# Patient Record
Sex: Male | Born: 1970 | Race: White | Hispanic: No | Marital: Married | Smoking: Former smoker
Health system: Southern US, Community
[De-identification: ages and names within clinical notes are randomized; demographics above are authoritative.]

## PROBLEM LIST (undated history)

## (undated) DIAGNOSIS — G40219 Localization-related (focal) (partial) symptomatic epilepsy and epileptic syndromes with complex partial seizures, intractable, without status epilepticus: Secondary | ICD-10-CM

## (undated) HISTORY — PX: OTHER SURGICAL HISTORY: SHX169

## (undated) HISTORY — PX: BRAIN SURGERY: SHX531

---

## 1998-01-09 ENCOUNTER — Emergency Department (HOSPITAL_COMMUNITY): Admission: EM | Admit: 1998-01-09 | Discharge: 1998-01-09 | Payer: Self-pay | Admitting: Emergency Medicine

## 2000-07-30 ENCOUNTER — Encounter: Payer: Self-pay | Admitting: Family Medicine

## 2000-07-30 ENCOUNTER — Encounter: Admission: RE | Admit: 2000-07-30 | Discharge: 2000-07-30 | Payer: Self-pay | Admitting: Family Medicine

## 2005-04-29 ENCOUNTER — Emergency Department (HOSPITAL_COMMUNITY): Admission: EM | Admit: 2005-04-29 | Discharge: 2005-04-29 | Payer: Self-pay | Admitting: Emergency Medicine

## 2011-04-03 ENCOUNTER — Emergency Department (HOSPITAL_COMMUNITY)
Admission: EM | Admit: 2011-04-03 | Discharge: 2011-04-04 | Disposition: A | Discharge status: Home / Self Care | Payer: 59 | Attending: Emergency Medicine | Admitting: Emergency Medicine

## 2011-04-03 DIAGNOSIS — R002 Palpitations: Secondary | ICD-10-CM | POA: Insufficient documentation

## 2011-04-03 DIAGNOSIS — G40909 Epilepsy, unspecified, not intractable, without status epilepticus: Secondary | ICD-10-CM | POA: Insufficient documentation

## 2011-04-03 DIAGNOSIS — E876 Hypokalemia: Secondary | ICD-10-CM | POA: Insufficient documentation

## 2011-04-03 DIAGNOSIS — Z79899 Other long term (current) drug therapy: Secondary | ICD-10-CM | POA: Insufficient documentation

## 2011-04-03 DIAGNOSIS — R000 Tachycardia, unspecified: Secondary | ICD-10-CM | POA: Insufficient documentation

## 2011-04-03 DIAGNOSIS — F411 Generalized anxiety disorder: Secondary | ICD-10-CM | POA: Insufficient documentation

## 2011-04-03 LAB — DIFFERENTIAL
Basophils Absolute: 0 10*3/uL (ref 0.0–0.1)
Basophils Relative: 0 % (ref 0–1)
Eosinophils Absolute: 0.1 10*3/uL (ref 0.0–0.7)
Eosinophils Relative: 1 % (ref 0–5)
Lymphocytes Relative: 26 % (ref 12–46)
Lymphs Abs: 2.6 10*3/uL (ref 0.7–4.0)
Monocytes Absolute: 0.8 10*3/uL (ref 0.1–1.0)
Monocytes Relative: 8 % (ref 3–12)
Neutro Abs: 6.5 10*3/uL (ref 1.7–7.7)
Neutrophils Relative %: 64 % (ref 43–77)

## 2011-04-03 LAB — CBC
HCT: 39.7 % (ref 39.0–52.0)
Hemoglobin: 13.8 g/dL (ref 13.0–17.0)
MCH: 30.9 pg (ref 26.0–34.0)
MCHC: 34.8 g/dL (ref 30.0–36.0)
MCV: 89 fL (ref 78.0–100.0)
Platelets: 256 10*3/uL (ref 150–400)
RBC: 4.46 MIL/uL (ref 4.22–5.81)
RDW: 12.7 % (ref 11.5–15.5)
WBC: 10.1 10*3/uL (ref 4.0–10.5)

## 2011-04-04 LAB — COMPREHENSIVE METABOLIC PANEL
ALT: 18 U/L (ref 0–53)
AST: 16 U/L (ref 0–37)
Albumin: 4.3 g/dL (ref 3.5–5.2)
Alkaline Phosphatase: 77 U/L (ref 39–117)
BUN: 19 mg/dL (ref 6–23)
CO2: 25 mEq/L (ref 19–32)
Calcium: 9.7 mg/dL (ref 8.4–10.5)
Chloride: 101 mEq/L (ref 96–112)
Creatinine, Ser: 0.94 mg/dL (ref 0.50–1.35)
GFR calc Af Amer: >90 mL/min (ref >90)
GFR calc non Af Amer: >90 mL/min (ref >90)
Glucose, Bld: 111 mg/dL — ABNORMAL HIGH (ref 70–99)
Potassium: 3.2 mEq/L — ABNORMAL LOW (ref 3.5–5.1)
Sodium: 138 mEq/L (ref 135–145)
Total Bilirubin: 0.5 mg/dL (ref 0.3–1.2)
Total Protein: 6.5 g/dL (ref 6.0–8.3)

## 2011-04-04 LAB — POCT I-STAT TROPONIN I: Troponin i, poc: 0 ng/mL (ref 0.00–0.08)

## 2011-04-04 LAB — MAGNESIUM: Magnesium: 2 mg/dL (ref 1.5–2.5)

## 2013-01-22 ENCOUNTER — Emergency Department (HOSPITAL_COMMUNITY): Payer: BC Managed Care – PPO

## 2013-01-22 ENCOUNTER — Emergency Department (HOSPITAL_COMMUNITY)
Admission: EM | Admit: 2013-01-22 | Discharge: 2013-01-22 | Disposition: A | Discharge status: Home / Self Care | Payer: BC Managed Care – PPO | Attending: Emergency Medicine | Admitting: Emergency Medicine

## 2013-01-22 ENCOUNTER — Encounter (HOSPITAL_COMMUNITY): Payer: Self-pay

## 2013-01-22 DIAGNOSIS — G40909 Epilepsy, unspecified, not intractable, without status epilepticus: Secondary | ICD-10-CM | POA: Insufficient documentation

## 2013-01-22 DIAGNOSIS — Y92410 Unspecified street and highway as the place of occurrence of the external cause: Secondary | ICD-10-CM | POA: Insufficient documentation

## 2013-01-22 DIAGNOSIS — Y9389 Activity, other specified: Secondary | ICD-10-CM | POA: Insufficient documentation

## 2013-01-22 DIAGNOSIS — S298XXA Other specified injuries of thorax, initial encounter: Secondary | ICD-10-CM | POA: Insufficient documentation

## 2013-01-22 LAB — RAPID URINE DRUG SCREEN, HOSP PERFORMED
Amphetamines: NOT DETECTED
Cocaine: NOT DETECTED
Opiates: NOT DETECTED
Tetrahydrocannabinol: POSITIVE — AB

## 2013-01-22 LAB — COMPREHENSIVE METABOLIC PANEL
AST: 28 U/L (ref 0–37)
Albumin: 4.4 g/dL (ref 3.5–5.2)
BUN: 18 mg/dL (ref 6–23)
Calcium: 9.4 mg/dL (ref 8.4–10.5)
Chloride: 101 mEq/L (ref 96–112)
Creatinine, Ser: 0.81 mg/dL (ref 0.50–1.35)
Total Protein: 7 g/dL (ref 6.0–8.3)

## 2013-01-22 LAB — URINALYSIS, ROUTINE W REFLEX MICROSCOPIC
Glucose, UA: NEGATIVE mg/dL
Hgb urine dipstick: NEGATIVE
Leukocytes, UA: NEGATIVE
Specific Gravity, Urine: 1.022 (ref 1.005–1.030)
pH: 7.5 (ref 5.0–8.0)

## 2013-01-22 LAB — CBC WITH DIFFERENTIAL/PLATELET
Basophils Absolute: 0 10*3/uL (ref 0.0–0.1)
Basophils Relative: 0 % (ref 0–1)
Eosinophils Absolute: 0 10*3/uL (ref 0.0–0.7)
HCT: 44.3 % (ref 39.0–52.0)
MCH: 32 pg (ref 26.0–34.0)
MCHC: 35.2 g/dL (ref 30.0–36.0)
Monocytes Absolute: 0.9 10*3/uL (ref 0.1–1.0)
Monocytes Relative: 7 % (ref 3–12)
Neutro Abs: 8.9 10*3/uL — ABNORMAL HIGH (ref 1.7–7.7)
RDW: 12.9 % (ref 11.5–15.5)

## 2013-01-22 MED ORDER — ONDANSETRON 4 MG PO TBDP
8.0000 mg | ORAL_TABLET | Freq: Once | ORAL | Status: AC
  Administered 2013-01-22: 8 mg via ORAL
  Filled 2013-01-22: qty 2

## 2013-01-22 MED ORDER — HYDROCODONE-ACETAMINOPHEN 5-325 MG PO TABS
2.0000 | ORAL_TABLET | Freq: Once | ORAL | Status: AC
  Administered 2013-01-22: 2 via ORAL
  Filled 2013-01-22: qty 2

## 2013-01-22 MED ORDER — HYDROMORPHONE HCL PF 2 MG/ML IJ SOLN
2.0000 mg | Freq: Once | INTRAMUSCULAR | Status: AC
  Administered 2013-01-22: 2 mg via INTRAMUSCULAR
  Filled 2013-01-22: qty 1

## 2013-01-22 MED ORDER — HYDROCODONE-ACETAMINOPHEN 5-325 MG PO TABS: 1.0000 | ORAL_TABLET | Freq: Four times a day (QID) | ORAL | Status: DC | PRN

## 2013-01-22 MED ORDER — LORAZEPAM 2 MG/ML IJ SOLN
1.0000 mg | Freq: Once | INTRAMUSCULAR | Status: AC
  Administered 2013-01-22: 1 mg via INTRAVENOUS
  Filled 2013-01-22: qty 1

## 2013-01-22 NOTE — ED Notes (Signed)
Called to CT to see what is taking so long. Tech sts they are running behind, will get him as soon as possible.
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
## 2013-01-22 NOTE — ED Notes (Signed)
Pt made aware of need for urine sample. Given urinal.
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
## 2013-01-22 NOTE — ED Notes (Signed)
Pt returned from radiology.
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
## 2013-01-22 NOTE — ED Notes (Signed)
Pt returned from CT, acting different than when he had left. Pt unable to respond to questions, trying to pull O2 monitor off. Friend at bedside sts he started speaking in spanish to him. Pt in nad, airway intact.
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
## 2015-06-16 ENCOUNTER — Other Ambulatory Visit: Payer: Self-pay | Admitting: Occupational Medicine

## 2015-06-16 ENCOUNTER — Ambulatory Visit: Payer: Self-pay

## 2015-06-16 DIAGNOSIS — Z0000 Encounter for general adult medical examination without abnormal findings: Secondary | ICD-10-CM

## 2017-03-14 ENCOUNTER — Emergency Department (HOSPITAL_COMMUNITY): Payer: Medicaid Other

## 2017-03-14 ENCOUNTER — Inpatient Hospital Stay (HOSPITAL_COMMUNITY)
Admission: EM | Admit: 2017-03-14 | Discharge: 2017-03-17 | DRG: 101 | Disposition: A | Discharge status: Home / Self Care | Payer: Medicaid Other | Attending: Internal Medicine | Admitting: Internal Medicine

## 2017-03-14 ENCOUNTER — Encounter (HOSPITAL_COMMUNITY): Payer: Self-pay | Admitting: Emergency Medicine

## 2017-03-14 DIAGNOSIS — G40909 Epilepsy, unspecified, not intractable, without status epilepticus: Secondary | ICD-10-CM

## 2017-03-14 DIAGNOSIS — R569 Unspecified convulsions: Secondary | ICD-10-CM

## 2017-03-14 DIAGNOSIS — F29 Unspecified psychosis not due to a substance or known physiological condition: Secondary | ICD-10-CM | POA: Diagnosis present

## 2017-03-14 DIAGNOSIS — E876 Hypokalemia: Secondary | ICD-10-CM | POA: Diagnosis present

## 2017-03-14 DIAGNOSIS — K648 Other hemorrhoids: Secondary | ICD-10-CM | POA: Diagnosis present

## 2017-03-14 DIAGNOSIS — N179 Acute kidney failure, unspecified: Secondary | ICD-10-CM | POA: Diagnosis present

## 2017-03-14 DIAGNOSIS — G40219 Localization-related (focal) (partial) symptomatic epilepsy and epileptic syndromes with complex partial seizures, intractable, without status epilepticus: Principal | ICD-10-CM | POA: Diagnosis present

## 2017-03-14 DIAGNOSIS — Z8249 Family history of ischemic heart disease and other diseases of the circulatory system: Secondary | ICD-10-CM

## 2017-03-14 DIAGNOSIS — Z9114 Patient's other noncompliance with medication regimen: Secondary | ICD-10-CM

## 2017-03-14 DIAGNOSIS — F05 Delirium due to known physiological condition: Secondary | ICD-10-CM | POA: Diagnosis present

## 2017-03-14 DIAGNOSIS — F28 Other psychotic disorder not due to a substance or known physiological condition: Secondary | ICD-10-CM

## 2017-03-14 DIAGNOSIS — F22 Delusional disorders: Secondary | ICD-10-CM

## 2017-03-14 DIAGNOSIS — Z23 Encounter for immunization: Secondary | ICD-10-CM

## 2017-03-14 DIAGNOSIS — M6282 Rhabdomyolysis: Secondary | ICD-10-CM | POA: Diagnosis present

## 2017-03-14 DIAGNOSIS — I10 Essential (primary) hypertension: Secondary | ICD-10-CM | POA: Diagnosis present

## 2017-03-14 HISTORY — DX: Localization-related (focal) (partial) symptomatic epilepsy and epileptic syndromes with complex partial seizures, intractable, without status epilepticus: G40.219

## 2017-03-14 LAB — CBC WITH DIFFERENTIAL/PLATELET
BASOS ABS: 0 10*3/uL (ref 0.0–0.1)
Basophils Relative: 0 %
Eosinophils Absolute: 0.1 10*3/uL (ref 0.0–0.7)
Eosinophils Relative: 1 %
HEMATOCRIT: 40.5 % (ref 39.0–52.0)
Hemoglobin: 14.1 g/dL (ref 13.0–17.0)
LYMPHS ABS: 2.8 10*3/uL (ref 0.7–4.0)
LYMPHS PCT: 33 %
MCH: 29.5 pg (ref 26.0–34.0)
MCHC: 34.8 g/dL (ref 30.0–36.0)
MCV: 84.7 fL (ref 78.0–100.0)
Monocytes Absolute: 0.8 10*3/uL (ref 0.1–1.0)
Monocytes Relative: 9 %
NEUTROS PCT: 57 %
Neutro Abs: 4.7 10*3/uL (ref 1.7–7.7)
Platelets: 362 10*3/uL (ref 150–400)
RBC: 4.78 MIL/uL (ref 4.22–5.81)
RDW: 12.7 % (ref 11.5–15.5)
WBC: 8.4 10*3/uL (ref 4.0–10.5)

## 2017-03-14 LAB — URINALYSIS, ROUTINE W REFLEX MICROSCOPIC
Bilirubin Urine: NEGATIVE
GLUCOSE, UA: NEGATIVE mg/dL
Hgb urine dipstick: NEGATIVE
KETONES UR: 5 mg/dL — AB
LEUKOCYTES UA: NEGATIVE
NITRITE: NEGATIVE
PH: 5 (ref 5.0–8.0)
PROTEIN: NEGATIVE mg/dL
Specific Gravity, Urine: 1.005 (ref 1.005–1.030)

## 2017-03-14 LAB — RAPID URINE DRUG SCREEN, HOSP PERFORMED
Amphetamines: NOT DETECTED
Barbiturates: NOT DETECTED
Benzodiazepines: NOT DETECTED
COCAINE: NOT DETECTED
OPIATES: NOT DETECTED
Tetrahydrocannabinol: NOT DETECTED

## 2017-03-14 LAB — COMPREHENSIVE METABOLIC PANEL
ALT: 26 U/L (ref 17–63)
AST: 36 U/L (ref 15–41)
Albumin: 4.7 g/dL (ref 3.5–5.0)
Alkaline Phosphatase: 81 U/L (ref 38–126)
Anion gap: 12 (ref 5–15)
BUN: 22 mg/dL — ABNORMAL HIGH (ref 6–20)
CHLORIDE: 106 mmol/L (ref 101–111)
CO2: 21 mmol/L — AB (ref 22–32)
Calcium: 9.4 mg/dL (ref 8.9–10.3)
Creatinine, Ser: 1.76 mg/dL — ABNORMAL HIGH (ref 0.61–1.24)
GFR, EST AFRICAN AMERICAN: 53 mL/min — AB (ref >60)
GFR, EST NON AFRICAN AMERICAN: 45 mL/min — AB (ref >60)
Glucose, Bld: 95 mg/dL (ref 65–99)
Potassium: 3.1 mmol/L — ABNORMAL LOW (ref 3.5–5.1)
SODIUM: 139 mmol/L (ref 135–145)
Total Bilirubin: 1.1 mg/dL (ref 0.3–1.2)
Total Protein: 7.3 g/dL (ref 6.5–8.1)

## 2017-03-14 LAB — PHOSPHORUS: Phosphorus: 4.6 mg/dL (ref 2.5–4.6)

## 2017-03-14 LAB — MAGNESIUM: Magnesium: 2 mg/dL (ref 1.7–2.4)

## 2017-03-14 LAB — ETHANOL

## 2017-03-14 MED ORDER — LORAZEPAM 1 MG PO TABS
1.0000 mg | ORAL_TABLET | Freq: Once | ORAL | Status: AC
  Administered 2017-03-14: 1 mg via ORAL
  Filled 2017-03-14: qty 1

## 2017-03-14 MED ORDER — LEVETIRACETAM ER 500 MG PO TB24
1000.0000 mg | ORAL_TABLET | Freq: Once | ORAL | Status: AC
  Administered 2017-03-15: 1000 mg via ORAL
  Filled 2017-03-14: qty 2

## 2017-03-14 MED ORDER — POTASSIUM CHLORIDE CRYS ER 20 MEQ PO TBCR
40.0000 meq | EXTENDED_RELEASE_TABLET | Freq: Once | ORAL | Status: AC
  Administered 2017-03-14: 40 meq via ORAL
  Filled 2017-03-14: qty 2

## 2017-03-14 MED ORDER — SODIUM CHLORIDE 0.9 % IV BOLUS (SEPSIS)
1000.0000 mL | Freq: Once | INTRAVENOUS | Status: AC
  Administered 2017-03-14: 1000 mL via INTRAVENOUS

## 2017-03-14 MED ORDER — HALOPERIDOL 5 MG PO TABS
5.0000 mg | ORAL_TABLET | Freq: Once | ORAL | Status: AC
  Administered 2017-03-14: 5 mg via ORAL
  Filled 2017-03-14: qty 1

## 2017-03-14 NOTE — ED Provider Notes (Signed)
[facility] DEPT Provider Note   CSN: [id number] Arrival date & time: 03/14/17  1240     History   Chief Complaint Chief Complaint  Patient presents with  . Seizures    HPI Omar Payne is a 46 y.o. male.  HPI  Pt was seen at [place]. Per pt and his family, c/o gradual onset and persistence of constant "delusions" and "paranoia" for the past 3 days. Pt's family states pt has been "having more seizures than usual" for the past 3 days, and "sleeping a lot." Pt's family states pt has been talking about "someone hacking his phone," "seeing the devil at his house." Pt endorses compliance with his meds. Pt does not recall events. Denies SI, no HI, no CP/palpitations, no SOB/cough, no abd pain, no N/V/D, no neck or back pain, no fevers, no injury, no focal motor weakness, no tingling/numbness in extremities.    Past Medical History:  Diagnosis Date  . Localization-related (focal) (partial) symptomatic epilepsy and epileptic syndromes with complex partial seizures, intractable, without status epilepticus ([place])     There are no active problems to display for this patient.   Past Surgical History:  Procedure Laterality Date  . BRAIN SURGERY     right temporal lobectomy  . VNS     VNS implant       Home Medications    Prior to Admission medications   Medication Sig Start Date End Date Taking? Authorizing Provider  Aloe-Sodium Chloride (AYR SALINE NASAL GEL NA) Place 1 application into the nose 2 (two) times daily as needed.   Yes [provider]  fluticasone (FLONASE) 50 MCG/ACT nasal spray Place 2 sprays into the nose daily. 01/30/17 01/30/18 Yes [provider]  levETIRAcetam (KEPPRA) 500 MG tablet Take 1,000 mg by mouth 2 (two) times daily.    Yes [provider]  zonisamide (ZONEGRAN) 100 MG capsule Take 100 mg by mouth daily.   Yes [provider]  HYDROcodone-acetaminophen (NORCO) 5-325 MG per tablet Take 1 tablet by mouth every 6 (six)  hours as needed for pain. Patient not taking: Reported on 03/14/2017 01/22/13   Orlie Dakin, MD    Family History History reviewed. No pertinent family history.  Social History Social History  Substance Use Topics  . Smoking status: Never Smoker  . Smokeless tobacco: Never Used  . Alcohol use No     Allergies   Patient has no known allergies.   Review of Systems Review of Systems ROS: Statement: All systems negative except as marked or noted in the HPI; Constitutional: Negative for fever and chills. ; ; Eyes: Negative for eye pain, redness and discharge. ; ; ENMT: Negative for ear pain, hoarseness, nasal congestion, sinus pressure and sore throat. ; ; Cardiovascular: Negative for chest pain, palpitations, diaphoresis, dyspnea and peripheral edema. ; ; Respiratory: Negative for cough, wheezing and stridor. ; ; Gastrointestinal: Negative for nausea, vomiting, diarrhea, abdominal pain, blood in stool, hematemesis, jaundice and rectal bleeding. . ; ; Genitourinary: Negative for dysuria, flank pain and hematuria. ; ; Musculoskeletal: Negative for back pain and neck pain. Negative for swelling and trauma.; ; Skin: Negative for pruritus, rash, abrasions, blisters, bruising and skin lesion.; ; Neuro: Negative for headache, lightheadedness and neck stiffness. Negative for weakness, extremity weakness, paresthesias, +involuntary movement, seizure.; Psych:  +delusions, paranoia. No SI, no SA, no HI, no hallucinations.      Physical Exam Updated Vital Signs BP 118/88 (BP Location: Right Arm)   Pulse 74   Temp  97.9 F (36.6 C) (Oral)   Resp 16   Ht 5\' 10"  (1.778 m)   Wt 72.6 kg (160 lb)   SpO2 100%   BMI 22.96 kg/m   Physical Exam 1953: Physical examination:  Nursing notes reviewed; Vital signs and O2 SAT reviewed;  Constitutional: Well developed, Well nourished, Well hydrated, In no acute distress; Head:  Normocephalic, atraumatic; Eyes: EOMI, PERRL, No scleral icterus; ENMT: Mouth and  pharynx normal, Mucous membranes moist; Neck: Supple, Full range of motion, No lymphadenopathy; Cardiovascular: Regular rate and rhythm, No gallop; Respiratory: Breath sounds clear & equal bilaterally, No wheezes.  Speaking full sentences with ease, Normal respiratory effort/excursion; Chest: Nontender, Movement normal; Abdomen: Soft, Nontender, Nondistended, Normal bowel sounds; Genitourinary: No CVA tenderness; Extremities: Pulses normal, No tenderness, No edema, No calf edema or asymmetry.; Neuro: AA&Ox3, Major CN grossly intact. No facial droop. Speech clear. No gross focal motor or sensory deficits in extremities.; Skin: Color normal, Warm, Dry.; Psych:  Affect flat.     ED Treatments / Results  Labs (all labs ordered are listed, but only abnormal results are displayed)   EKG  EKG Interpretation  Date/Time:  Friday March 14 2017 20:30:29 EDT Ventricular Rate:  97 PR Interval:    QRS Duration: 98 QT Interval:  362 QTC Calculation: 460 R Axis:   94 Text Interpretation:  Sinus rhythm Borderline right axis deviation Baseline wander When compared with ECG of 04/03/2011 No significant change was found Confirmed by Francine Graven [phone]) on 03/14/2017 8:36:07 PM       Radiology   Procedures Procedures (including critical care time)  Medications Ordered in ED Medications - No data to display   Initial Impression / Assessment and Plan / ED Course  I have reviewed the triage vital signs and the nursing notes.  Pertinent labs & imaging results that were available during my care of the patient were reviewed by me and considered in my medical decision making (see chart for details).  MDM Reviewed: previous chart, nursing note and vitals Reviewed previous: labs and ECG Interpretation: labs, ECG, x-ray and CT scan   Results for orders placed or performed during the hospital encounter of 03/14/17  Comprehensive metabolic panel  Result Value Ref Range   Sodium 139 135 - 145  mmol/L   Potassium 3.1 (L) 3.5 - 5.1 mmol/L   Chloride 106 101 - 111 mmol/L   CO2 21 (L) 22 - 32 mmol/L   Glucose, Bld 95 65 - 99 mg/dL   BUN 22 (H) 6 - 20 mg/dL   Creatinine, Ser 1.76 (H) 0.61 - 1.24 mg/dL   Calcium 9.4 8.9 - 10.3 mg/dL   Total Protein 7.3 6.5 - 8.1 g/dL   Albumin 4.7 3.5 - 5.0 g/dL   AST 36 15 - 41 U/L   ALT 26 17 - 63 U/L   Alkaline Phosphatase 81 38 - 126 U/L   Total Bilirubin 1.1 0.3 - 1.2 mg/dL   GFR calc non Af Amer 45 (L) >60 mL/min   GFR calc Af Amer 53 (L) >60 mL/min   Anion gap 12 5 - 15  Ethanol  Result Value Ref Range   Alcohol, Ethyl (B) <1 <10 mg/dL  CBC with Differential  Result Value Ref Range   WBC 8.4 4.0 - 10.5 K/uL   RBC 4.78 4.22 - 5.81 MIL/uL   Hemoglobin 14.1 13.0 - 17.0 g/dL   HCT 40.5 39.0 - 52.0 %   MCV 84.7 78.0 - 100.0 fL  MCH 29.5 26.0 - 34.0 pg   MCHC 34.8 30.0 - 36.0 g/dL   RDW 12.7 11.5 - 15.5 %   Platelets 362 150 - 400 K/uL   Neutrophils Relative % 57 %   Neutro Abs 4.7 1.7 - 7.7 K/uL   Lymphocytes Relative 33 %   Lymphs Abs 2.8 0.7 - 4.0 K/uL   Monocytes Relative 9 %   Monocytes Absolute 0.8 0.1 - 1.0 K/uL   Eosinophils Relative 1 %   Eosinophils Absolute 0.1 0.0 - 0.7 K/uL   Basophils Relative 0 %   Basophils Absolute 0.0 0.0 - 0.1 K/uL  Urine rapid drug screen (hosp performed)  Result Value Ref Range   Opiates NONE DETECTED NONE DETECTED   Cocaine NONE DETECTED NONE DETECTED   Benzodiazepines NONE DETECTED NONE DETECTED   Amphetamines NONE DETECTED NONE DETECTED   Tetrahydrocannabinol NONE DETECTED NONE DETECTED   Barbiturates NONE DETECTED NONE DETECTED  Urinalysis, Routine w reflex microscopic  Result Value Ref Range   Color, Urine STRAW (A) YELLOW   APPearance CLEAR CLEAR   Specific Gravity, Urine 1.005 1.005 - 1.030   pH 5.0 5.0 - 8.0   Glucose, UA NEGATIVE NEGATIVE mg/dL   Hgb urine dipstick NEGATIVE NEGATIVE   Bilirubin Urine NEGATIVE NEGATIVE   Ketones, ur 5 (A) NEGATIVE mg/dL   Protein, ur  NEGATIVE NEGATIVE mg/dL   Nitrite NEGATIVE NEGATIVE   Leukocytes, UA NEGATIVE NEGATIVE   Dg Chest 2 View Result Date: 03/14/2017 CLINICAL DATA:  Seizures EXAM: CHEST  2 VIEW COMPARISON:  Chest radiograph 06/16/2015 FINDINGS: Position of left chest wall stimulator generator is unchanged. The associated lead terminates at the level of the C6 vertebra. The heart size and mediastinal contours are within normal limits. Both lungs are clear. The visualized skeletal structures are unremarkable. IMPRESSION: No active cardiopulmonary disease. Electronically Signed   By: Ulyses Jarred M.D.   On: 03/14/2017 20:24   Ct Head Wo Contrast Result Date: 03/14/2017 CLINICAL DATA:  Hallucinations and altered mental status. EXAM: CT HEAD WITHOUT CONTRAST TECHNIQUE: Contiguous axial images were obtained from the base of the skull through the vertex without intravenous contrast. COMPARISON:  Head CT 01/22/2013 FINDINGS: Brain: No mass lesion, intraparenchymal hemorrhage or extra-axial collection. No evidence of acute cortical infarct. Status post right temporal lobectomy. Otherwise normal appearance of the brain parenchyma. Vascular: No hyperdense vessel or unexpected calcification. Skull: Remote right frontotemporal craniotomy. Sinuses/Orbits: No sinus fluid levels or advanced mucosal thickening. No mastoid effusion. Normal orbits. IMPRESSION: Status post remote right temporal lobectomy. Otherwise normal appearance of the brain. Electronically Signed   By: Ulyses Jarred M.D.   On: 03/14/2017 20:31    2205  BUN/Cr mildly elevated; IVF bolus given. Potassium repleted PO.  T/C to Neuro Dr. 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   T/C to [facility] Dr. Olevia Bowens, case discussed, including:  HPI, pertinent PM/SHx, VS/PE, dx testing, ED course and treatment, as well as d/w Neuro MD above:  Agreeable to admit.   Final Clinical Impressions(s) / ED Diagnoses   Final diagnoses:  None    New Prescriptions New Prescriptions   No medications on file     Francine Graven,  DO 03/19/17 1122
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
## 2017-03-14 NOTE — H&P (Signed)
History and Physical    Omar Payne UQJ:[id number] DOB: 23-Oct-1970 DOA: 03/14/2017  PCP: Patient, No Pcp Per   Patient coming from: Home.  I have personally briefly reviewed patient's old medical records in [place]  Chief Complaint: Seizure.  HPI: Omar Payne is a 46 y.o. 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 The patient told his wife that he is being monitor and tracked through his VNS device which has been hacked. He also when walking, caught a ride to his mother's house and call his wife to tell her that the devil was at his mom's house. His wife also showed me to be due from her cell phone in which the patient is moving his arms without any purpose, while being close to the door.  He was given Haldol 5 mg IVP by Dr. Thurnell Garbe, third neurology recommendation, just before I evaluated him and he was talking more properly. He denies fever, chills, headache, sore throat, productive cough, dyspnea, chest pain, dizziness, palpitations, diaphoresis, lower extremity edema, abdominal pain, diarrhea, constipation, melena or hematochezia. No dysuria, frequency, hematuria, polyuria, polydipsia or blurred vision.  ED Course: His initial vital signs were normal. Urinalysis showed minimal ketonuria, but Was Otherwise Normal. CBC did not show any abnormalities. CMP showed a potassium of 3.1, bicarbonate of 21 mmol/L. BUN of 22 and creatinine of 1.76 mg/dL (his baseline is usually under 1.0). All other values all his CMP were normal. Magnesium was 2.0 and phosphorus 4.6 mg/dL. Chest radiograph and CT head without contrast did not show any acute abnormalities.  Review of Systems: As per HPI otherwise 10 point review of systems negative.    Past  Medical History:  Diagnosis Date  . Localization-related (focal) (partial) symptomatic epilepsy and epileptic syndromes with complex partial seizures, intractable, without status epilepticus [facility])     Past Surgical History:  Procedure Laterality Date  . BRAIN SURGERY     right temporal lobectomy  . VNS     VNS implant     reports that he has never smoked. He has never used smokeless tobacco. He reports that he does not drink alcohol or use drugs.  No Known Allergies  Family History  Problem Relation Age of Onset  . Hypertension Father   . Bipolar disorder Brother     Prior to Admission medications   Medication Sig Start Date End Date Taking? Authorizing Provider  Aloe-Sodium Chloride (AYR SALINE NASAL GEL NA) Place 1 application into the nose 2 (two) times daily as needed.   Yes [provider]  fluticasone (FLONASE) 50 MCG/ACT nasal spray Place 2 sprays into the nose daily. 01/30/17 01/30/18 Yes [provider]  levETIRAcetam (KEPPRA) 500 MG tablet Take 1,000 mg by mouth 2 (two) times daily.    Yes [provider]  zonisamide (ZONEGRAN) 100 MG capsule Take 100 mg by mouth daily.   Yes [provider]  HYDROcodone-acetaminophen (NORCO) 5-325 MG per tablet Take 1 tablet by mouth every 6 (six) hours as needed for pain. Patient not taking: Reported on 03/14/2017 01/22/13   Orlie Dakin, MD    Physical Exam: Vitals:   03/14/17 1255 03/14/17 1627 03/14/17 1919 03/14/17 2115  BP: 114/84 125/87 118/88 138/79  Pulse: 75 (!) 110 74 81  Resp: 16 20 16  13  Temp: 97.9 F (36.6 C) 97.9 F (36.6 C)    TempSrc: Oral Oral    SpO2: 100% 97% 100% 100%  Weight: 72.6 kg (160 lb)     Height: 5\' 10"  (1.778 m)       Constitutional: NAD, calm, comfortable Eyes: PERRL, lids and conjunctivae normal ENMT: Mucous membranes are moist. Posterior pharynx clear of any exudate or lesions. Neck: normal, supple, no masses, no thyromegaly Respiratory: clear to  auscultation bilaterally, no wheezing, no crackles. Normal respiratory effort. No accessory muscle use.  Cardiovascular: Regular rate and rhythm, no murmurs / rubs / gallops. No extremity edema. 2+ pedal pulses. No carotid bruits.  Abdomen: Soft, no tenderness, no masses palpated. No hepatosplenomegaly. Bowel sounds positive.  Musculoskeletal: no clubbing / cyanosis. No joint deformity upper and lower extremities. Good ROM, no contractures. Normal muscle tone.  Skin: no rashes, lesions, ulcers. No induration Neurologic: CN 2-12 grossly intact. Sensation intact, DTR normal. Strength 5/5 in all 4.  Psychiatric: Normal judgment and insight. Alert and oriented x 4. Normal mood.    Labs on Admission: I have personally reviewed following labs and imaging studies  CBC:  Recent Labs Lab 03/14/17 2039  WBC 8.4  NEUTROABS 4.7  HGB 14.1  HCT 40.5  MCV 84.7  PLT 831   Basic Metabolic Panel:  Recent Labs Lab 03/14/17 2039  NA 139  K 3.1*  CL 106  CO2 21*  GLUCOSE 95  BUN 22*  CREATININE 1.76*  CALCIUM 9.4   GFR: Estimated Creatinine Clearance: 55 mL/min (A) (by C-G formula based on SCr of 1.76 mg/dL (H)). Liver Function Tests:  Recent Labs Lab 03/14/17 2039  AST 36  ALT 26  ALKPHOS 81  BILITOT 1.1  PROT 7.3  ALBUMIN 4.7   No results for input(s): LIPASE, AMYLASE in the last 168 hours. No results for input(s): AMMONIA in the last 168 hours. Coagulation Profile: No results for input(s): INR, PROTIME in the last 168 hours. Cardiac Enzymes: No results for input(s): CKTOTAL, CKMB, CKMBINDEX, TROPONINI in the last 168 hours. BNP (last 3 results) No results for input(s): PROBNP in the last 8760 hours. HbA1C: No results for input(s): HGBA1C in the last 72 hours. CBG: No results for input(s): GLUCAP in the last 168 hours. Lipid Profile: No results for input(s): CHOL, HDL, LDLCALC, TRIG, CHOLHDL, LDLDIRECT in the last 72 hours. Thyroid Function Tests: No results for  input(s): TSH, T4TOTAL, FREET4, T3FREE, THYROIDAB in the last 72 hours. Anemia Panel: No results for input(s): VITAMINB12, FOLATE, FERRITIN, TIBC, IRON, RETICCTPCT in the last 72 hours. Urine analysis:    Component Value Date/Time   COLORURINE STRAW (A) 03/14/2017 2135   APPEARANCEUR CLEAR 03/14/2017 2135   LABSPEC 1.005 03/14/2017 2135   PHURINE 5.0 03/14/2017 2135   GLUCOSEU NEGATIVE 03/14/2017 2135   HGBUR NEGATIVE 03/14/2017 2135   BILIRUBINUR NEGATIVE 03/14/2017 2135   KETONESUR 5 (A) 03/14/2017 2135   PROTEINUR NEGATIVE 03/14/2017 2135   UROBILINOGEN 1.0 01/22/2013 1517   NITRITE NEGATIVE 03/14/2017 2135   LEUKOCYTESUR NEGATIVE 03/14/2017 2135    Radiological Exams on Admission: Dg Chest 2 View  Result Date: 03/14/2017 CLINICAL DATA:  Seizures EXAM: CHEST  2 VIEW COMPARISON:  Chest radiograph 06/16/2015 FINDINGS: Position of left chest wall stimulator generator is unchanged. The associated lead terminates at the level of the C6 vertebra. The heart size and mediastinal contours are within normal limits. Both lungs are clear. The visualized skeletal structures are unremarkable. IMPRESSION: No active cardiopulmonary disease. Electronically Signed  By: Ulyses Jarred M.D.   On: 03/14/2017 20:24   Ct Head Wo Contrast  Result Date: 03/14/2017 CLINICAL DATA:  Hallucinations and altered mental status. EXAM: CT HEAD WITHOUT CONTRAST TECHNIQUE: Contiguous axial images were obtained from the base of the skull through the vertex without intravenous contrast. COMPARISON:  Head CT 01/22/2013 FINDINGS: Brain: No mass lesion, intraparenchymal hemorrhage or extra-axial collection. No evidence of acute cortical infarct. Status post right temporal lobectomy. Otherwise normal appearance of the brain parenchyma. Vascular: No hyperdense vessel or unexpected calcification. Skull: Remote right frontotemporal craniotomy. Sinuses/Orbits: No sinus fluid levels or advanced mucosal thickening. No mastoid effusion.  Normal orbits. IMPRESSION: Status post remote right temporal lobectomy. Otherwise normal appearance of the brain. Electronically Signed   By: Ulyses Jarred M.D.   On: 03/14/2017 20:31    EKG: Independently reviewed. Vent. rate 97 BPM PR interval * ms QRS duration 98 ms QT/QTc 362/460 ms P-R-T axes 79 94 32 Sinus rhythm Borderline right axis deviation Baseline wander  Assessment/Plan Principal Problem:   Seizures (HCC) Observation/telemetry. Seizure precautions. Continue Keppra and Zonegram. EEG in the morning. Neurology is on the case.  Active Problems:   Hypokalemia Replaced. Follow-up potassium level.    AKI (acute kidney injury) ([place]) Received a normal saline bolus. Follow-up renal function and electrolytes.    Post-ictal confusion   Psychosis (HCC) Haloperidol 5 mg IVP given. Psychiatry consult was requested by Dr. Thurnell Garbe.     DVT prophylaxis: Heparin SQ. Code Status: Full code. Family Communication: His wife was present in the ED. Disposition Plan: Overnight observation, neurology and psych evaluation. Consults called: Neurology. Admission status: Observation/telemetry.   Reubin Milan MD [facility] Pager [phone].  If 7PM-7AM, please contact night-coverage [email or web address] Password [email or web address]  03/14/2017, 11:15 PM

## 2017-03-14 NOTE — ED Notes (Signed)
Pt's mother states he is very paranoid, Pt is talking about someone hacking his phone, he is seeing the devil at his house. Pt is walking in and out of lobby, Pt's mother states he is trying to leave and he is delusional about what is happening to him. Pt's mother states he had brain surgery about 11 years ago where they removed scar tissue from his temporal lobe for his seizures, and has not been right since and is getting worse especially within the last 2 days.

## 2017-03-15 DIAGNOSIS — Z818 Family history of other mental and behavioral disorders: Secondary | ICD-10-CM

## 2017-03-15 DIAGNOSIS — R443 Hallucinations, unspecified: Secondary | ICD-10-CM

## 2017-03-15 DIAGNOSIS — F29 Unspecified psychosis not due to a substance or known physiological condition: Secondary | ICD-10-CM

## 2017-03-15 DIAGNOSIS — R569 Unspecified convulsions: Secondary | ICD-10-CM

## 2017-03-15 DIAGNOSIS — F05 Delirium due to known physiological condition: Secondary | ICD-10-CM

## 2017-03-15 DIAGNOSIS — F23 Brief psychotic disorder: Secondary | ICD-10-CM

## 2017-03-15 LAB — BASIC METABOLIC PANEL
ANION GAP: 10 (ref 5–15)
BUN: 19 mg/dL (ref 6–20)
CALCIUM: 9.1 mg/dL (ref 8.9–10.3)
CHLORIDE: 110 mmol/L (ref 101–111)
CO2: 21 mmol/L — AB (ref 22–32)
Creatinine, Ser: 1.63 mg/dL — ABNORMAL HIGH (ref 0.61–1.24)
GFR calc non Af Amer: 50 mL/min — ABNORMAL LOW (ref >60)
GFR, EST AFRICAN AMERICAN: 58 mL/min — AB (ref >60)
GLUCOSE: 97 mg/dL (ref 65–99)
POTASSIUM: 3.8 mmol/L (ref 3.5–5.1)
Sodium: 141 mmol/L (ref 135–145)

## 2017-03-15 LAB — HIV ANTIBODY (ROUTINE TESTING W REFLEX): HIV Screen 4th Generation wRfx: NONREACTIVE

## 2017-03-15 MED ORDER — ONDANSETRON HCL 4 MG/2ML IJ SOLN: 4.0000 mg | Freq: Four times a day (QID) | INTRAMUSCULAR | Status: DC | PRN

## 2017-03-15 MED ORDER — HALOPERIDOL 1 MG PO TABS: 0.5000 mg | ORAL_TABLET | Freq: Two times a day (BID) | ORAL | Status: DC

## 2017-03-15 MED ORDER — HALOPERIDOL 1 MG PO TABS
0.5000 mg | ORAL_TABLET | Freq: Two times a day (BID) | ORAL | Status: DC
  Administered 2017-03-15 – 2017-03-17 (×4): 0.5 mg via ORAL
  Filled 2017-03-15 (×4): qty 1

## 2017-03-15 MED ORDER — LEVETIRACETAM ER 500 MG PO TB24
1500.0000 mg | ORAL_TABLET | Freq: Every day | ORAL | Status: DC
  Filled 2017-03-15 (×2): qty 3

## 2017-03-15 MED ORDER — POTASSIUM CHLORIDE IN NACL 20-0.9 MEQ/L-% IV SOLN
INTRAVENOUS | Status: DC
  Administered 2017-03-15 – 2017-03-16 (×3): via INTRAVENOUS
  Filled 2017-03-15 (×7): qty 1000

## 2017-03-15 MED ORDER — ONDANSETRON HCL 4 MG PO TABS: 4.0000 mg | ORAL_TABLET | Freq: Four times a day (QID) | ORAL | Status: DC | PRN

## 2017-03-15 MED ORDER — LORAZEPAM 2 MG/ML IJ SOLN: 1.0000 mg | INTRAMUSCULAR | Status: DC | PRN

## 2017-03-15 MED ORDER — POTASSIUM CHLORIDE IN NACL 20-0.9 MEQ/L-% IV SOLN
INTRAVENOUS | Status: DC
  Filled 2017-03-15: qty 1000

## 2017-03-15 MED ORDER — LEVETIRACETAM ER 500 MG PO TB24: 750.0000 mg | ORAL_TABLET | Freq: Every day | ORAL | Status: DC

## 2017-03-15 MED ORDER — ENOXAPARIN SODIUM 40 MG/0.4ML ~~LOC~~ SOLN
40.0000 mg | SUBCUTANEOUS | Status: DC
  Administered 2017-03-16: 40 mg via SUBCUTANEOUS
  Filled 2017-03-15 (×2): qty 0.4

## 2017-03-15 MED ORDER — ZONISAMIDE 100 MG PO CAPS
600.0000 mg | ORAL_CAPSULE | Freq: Two times a day (BID) | ORAL | Status: DC
  Administered 2017-03-15 – 2017-03-17 (×5): 600 mg via ORAL
  Filled 2017-03-15 (×5): qty 6

## 2017-03-15 MED ORDER — FLUTICASONE PROPIONATE 50 MCG/ACT NA SUSP
2.0000 | Freq: Every day | NASAL | Status: DC
  Administered 2017-03-16 – 2017-03-17 (×2): 2 via NASAL
  Filled 2017-03-15 (×2): qty 16

## 2017-03-15 MED ORDER — LEVETIRACETAM 500 MG PO TABS
1000.0000 mg | ORAL_TABLET | Freq: Two times a day (BID) | ORAL | Status: DC
  Administered 2017-03-15 – 2017-03-16 (×2): 1000 mg via ORAL
  Filled 2017-03-15 (×2): qty 2

## 2017-03-15 MED ORDER — ZONISAMIDE 100 MG PO CAPS: 100.0000 mg | ORAL_CAPSULE | Freq: Every day | ORAL | Status: DC

## 2017-03-15 NOTE — ED Notes (Signed)
[facility] called for pick up. 

## 2017-03-15 NOTE — Progress Notes (Signed)
Patient arrived to unit.  Alert, verbal.  Able to voice name, DOB, place.  Patient somewhat confused to time of year.  Voices no suicidal thoughts.  Seizure precautions in place.  Patient resting in bed, call light in reach.

## 2017-03-15 NOTE — Progress Notes (Signed)
Patient has red blood in his stool.  Patient states that this has been going on for a long time.  Patient states no pain with stool or experiencing constipation.    RN will continue to monitor
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
## 2017-03-15 NOTE — Progress Notes (Signed)
[facility] Progress Note  Patient: Omar Payne WUJ:[id number]   PCP: Patient, No Pcp Per DOB: [birth year]   DOA: 03/14/2017   DOS: 03/15/2017   Date of Service: the patient was seen and examined on 03/15/2017  Subjective: Feeling better, no nausea no vomiting. No headache. Mother at bedside reports no hallucination since present here.  Brief hospital course: Pt. with PMH of intractable partial seizures, right temporal lobectomy, S/P VNS; admitted on 03/14/2017, presented with complaint of seizure and hallucination, was found to have seizure likely from noncompliance, postictal psychosis. Currently further plan is further workup with EEG. Patient will be transferred to [facility] for the same.  Assessment and Plan: 1. History of seizure disorder. Recurrent seizures. Patient is awake and does not appear to be having any active encephalopathy. Likely seizures at home due to noncompliance. Patient was given loading dose of Keppra and currently on home regimen of Keppra and Zonegran. EEG is ordered, patient will be transferred to [facility] for the same. Appreciate neurology assistance.  2. Hallucination. Psychosis. Patient had visual hallucination as well as paranoid behavior. Psychiatry was consulted. Per psychiatry"no evidence of imminent risk to self or others at present. Patient does not meet criteria for psychiatric inpatient admission. Haloperidol 1 mg half tablet twice daily for psychosis/delusions". Patient is cleared by psychiatric service.  3. Hypokalemia. Currently being replaced. Recheck tomorrow.  4. Mild acute kidney injury. Received a bolus. Getting IV fluid. Recheck tomorrow.  Diet: regular DVT Prophylaxis: subcutaneous Heparin  Advance goals of care discussion: full code  Family Communication: family was present at bedside, at the time of interview. The pt provided permission to discuss medical plan with the family. Opportunity was given to  ask question and all questions were answered satisfactorily.   Disposition:  Discharge to home.  Consultants: neurology, psych Procedures: EEG  Antibiotics: Anti-infectives    None       Objective: Physical Exam: Vitals:   03/15/17 1100 03/15/17 1102 03/15/17 1147 03/15/17 1300  BP: 120/86 115/78  (!) 123/94  Pulse: (!) 58 62  71  Resp: 19 12  13   Temp:   98 F (36.7 C)   TempSrc:   Oral   SpO2: 98% 99%  98%  Weight:      Height:        Intake/Output Summary (Last 24 hours) at 03/15/17 1400 Last data filed at 03/15/17 0246  Gross per 24 hour  Intake             1000 ml  Output                0 ml  Net             1000 ml   Filed Weights   03/14/17 1255  Weight: 72.6 kg (160 lb)   General: Alert, Awake and Oriented to Time, Place and Person. Appear in mild distress, affect appropriate Eyes: PERRL, Conjunctiva normal ENT: Oral Mucosa clear moist. Cardiovascular: S1 and S2 Present, no Murmur, Peripheral Pulses Present Respiratory: normal respiratory effort, Bilateral Air entry equal and Decreased, no use of accessory muscle, Clear to Auscultation, no Crackles, no wheezes Abdomen: Bowel Sound present, Soft and no tenderness, no hernia Skin: no redness, no Rash, no induration Extremities: no Pedal edema, no calf tenderness Neurologic: Grossly no focal neuro deficit. Bilaterally Equal motor strength  Data Reviewed: CBC:  Recent Labs Lab 03/14/17 2039  WBC 8.4  NEUTROABS 4.7  HGB 14.1  HCT 40.5  MCV 84.7  PLT 622   Basic Metabolic Panel:  Recent Labs Lab 03/14/17 2039 03/14/17 2330 03/15/17 0712  NA 139  --  141  K 3.1*  --  3.8  CL 106  --  110  CO2 21*  --  21*  GLUCOSE 95  --  97  BUN 22*  --  19  CREATININE 1.76*  --  1.63*  CALCIUM 9.4  --  9.1  MG  --  2.0  --   PHOS  --  4.6  --     Liver Function Tests:  Recent Labs Lab 03/14/17 2039  AST 36  ALT 26  ALKPHOS 81  BILITOT 1.1  PROT 7.3  ALBUMIN 4.7   No results for input(s):  LIPASE, AMYLASE in the last 168 hours. No results for input(s): AMMONIA in the last 168 hours. Coagulation Profile: No results for input(s): INR, PROTIME in the last 168 hours. Cardiac Enzymes: No results for input(s): CKTOTAL, CKMB, CKMBINDEX, TROPONINI in the last 168 hours. BNP (last 3 results) No results for input(s): PROBNP in the last 8760 hours. CBG: No results for input(s): GLUCAP in the last 168 hours. Studies: Dg Chest 2 View  Result Date: 03/14/2017 CLINICAL DATA:  Seizures EXAM: CHEST  2 VIEW COMPARISON:  Chest radiograph 06/16/2015 FINDINGS: Position of left chest wall stimulator generator is unchanged. The associated lead terminates at the level of the C6 vertebra. The heart size and mediastinal contours are within normal limits. Both lungs are clear. The visualized skeletal structures are unremarkable. IMPRESSION: No active cardiopulmonary disease. Electronically Signed   By: Ulyses Jarred M.D.   On: 03/14/2017 20:24   Ct Head Wo Contrast  Result Date: 03/14/2017 CLINICAL DATA:  Hallucinations and altered mental status. EXAM: CT HEAD WITHOUT CONTRAST TECHNIQUE: Contiguous axial images were obtained from the base of the skull through the vertex without intravenous contrast. COMPARISON:  Head CT 01/22/2013 FINDINGS: Brain: No mass lesion, intraparenchymal hemorrhage or extra-axial collection. No evidence of acute cortical infarct. Status post right temporal lobectomy. Otherwise normal appearance of the brain parenchyma. Vascular: No hyperdense vessel or unexpected calcification. Skull: Remote right frontotemporal craniotomy. Sinuses/Orbits: No sinus fluid levels or advanced mucosal thickening. No mastoid effusion. Normal orbits. IMPRESSION: Status post remote right temporal lobectomy. Otherwise normal appearance of the brain. Electronically Signed   By: Ulyses Jarred M.D.   On: 03/14/2017 20:31    Scheduled Meds: . enoxaparin (LOVENOX) injection  40 mg Subcutaneous Q24H  . fluticasone   2 spray Each Nare Daily  . haloperidol  0.5 mg Oral BID  . levETIRAcetam  1,500 mg Oral Daily  . levETIRAcetam  1,000 mg Oral BID  . zonisamide  600 mg Oral BID   Continuous Infusions: . 0.9 % NaCl with KCl 20 mEq / L     PRN Meds: LORazepam, ondansetron **OR** ondansetron (ZOFRAN) IV  Time spent: 35 minutes  Author: Berle Mull, MD [facility] Pager: [phone] 03/15/2017 2:00 PM  If 7PM-7AM, please contact night-coverage at [email or web address], password [facility]

## 2017-03-15 NOTE — ED Notes (Signed)
NEUROLOGY CONSULT AT THE BEDSIDE.

## 2017-03-15 NOTE — Consult Note (Signed)
[place] Psychiatry Consult   Reason for Consult:  Psychosis, paranoia Referring Physician:  Dr. Posey Pronto Patient Identification: Omar Payne MRN:  [id number] Principal Diagnosis: Psychosis [facility]) Diagnosis:   Patient Active Problem List   Diagnosis Date Noted  . Seizures ([place]) [R56.9] 03/14/2017  . Hypokalemia [E87.6] 03/14/2017  . AKI (acute kidney injury) ([place]) [N17.9] 03/14/2017  . Post-ictal confusion [F05] 03/14/2017  . Psychosis ([place]) [F29] 03/14/2017    Total Time spent with patient: 45 minutes  Subjective:   Omar Payne is a 46 y.o. male patient admitted with hallucination.  HPI:  Patient who denies prior history of mental illness but was brought to [facility] for evaluation of 2 days history of hallucination and paranoia following multiple seizure episodes on Wednesday/Thursday. Patient reports he was seeing shows on his TV that weren't there. Also reports that he felt Devil was in his house and was able to hacked into all the electronic devices at his home and  his VNS machine and able to track him. Patient reports that he did not sleep well for 2 days. However, he is doing well today after he got a dose of Haloperidol last night. Patient denies drugs/alcohol abuse, depression, psychosis, SI/HI or delusions.  Past Psychiatric History: None  Risk to Self: Is patient at risk for suicide?: No Risk to Others:   Prior Inpatient Therapy:   Prior Outpatient Therapy:    Past Medical History:  Past Medical History:  Diagnosis Date  . Localization-related (focal) (partial) symptomatic epilepsy and epileptic syndromes with complex partial seizures, intractable, without status epilepticus [facility])     Past Surgical History:  Procedure Laterality Date  . BRAIN SURGERY     right temporal lobectomy  . VNS     VNS implant   Family History:  Family History  Problem Relation Age of Onset  . Hypertension Father   . Bipolar disorder Brother    Family Psychiatric  History:   Social History:  History  Alcohol Use No     History  Drug Use No    Social History   Social History  . Marital status: Married    Spouse name: N/A  . Number of children: N/A  . Years of education: N/A   Social History Main Topics  . Smoking status: Never Smoker  . Smokeless tobacco: Never Used  . Alcohol use No  . Drug use: No  . Sexual activity: Not Asked   Other Topics Concern  . None   Social History Narrative  . None   Additional Social History:    Allergies:  No Known Allergies  Labs:  Results for orders placed or performed during the hospital encounter of 03/14/17 (from the past 48 hour(s))  Comprehensive metabolic panel     Status: Abnormal   Collection Time: 03/14/17  8:39 PM  Result Value Ref Range   Sodium 139 135 - 145 mmol/L   Potassium 3.1 (L) 3.5 - 5.1 mmol/L   Chloride 106 101 - 111 mmol/L   CO2 21 (L) 22 - 32 mmol/L   Glucose, Bld 95 65 - 99 mg/dL   BUN 22 (H) 6 - 20 mg/dL   Creatinine, Ser 1.76 (H) 0.61 - 1.24 mg/dL   Calcium 9.4 8.9 - 10.3 mg/dL   Total Protein 7.3 6.5 - 8.1 g/dL   Albumin 4.7 3.5 - 5.0 g/dL   AST 36 15 - 41 U/L   ALT 26 17 - 63 U/L   Alkaline Phosphatase 81 38 -  126 U/L   Total Bilirubin 1.1 0.3 - 1.2 mg/dL   GFR calc non Af Amer 45 (L) >60 mL/min   GFR calc Af Amer 53 (L) >60 mL/min    Comment: (NOTE) The eGFR has been calculated using the CKD EPI equation. This calculation has not been validated in all clinical situations. eGFR's persistently <60 mL/min signify possible Chronic Kidney Disease.    Anion gap 12 5 - 15  Ethanol     Status: None   Collection Time: 03/14/17  8:39 PM  Result Value Ref Range   Alcohol, Ethyl (B) <1 <10 mg/dL    Comment: <10        LOWEST DETECTABLE LIMIT FOR SERUM ALCOHOL IS 10 mg/dL FOR MEDICAL PURPOSES ONLY Please note change in reference range.   CBC with Differential     Status: None   Collection Time: 03/14/17  8:39 PM  Result Value Ref Range   WBC 8.4 4.0 - 10.5 K/uL    RBC 4.78 4.22 - 5.81 MIL/uL   Hemoglobin 14.1 13.0 - 17.0 g/dL   HCT 40.5 39.0 - 52.0 %   MCV 84.7 78.0 - 100.0 fL   MCH 29.5 26.0 - 34.0 pg   MCHC 34.8 30.0 - 36.0 g/dL   RDW 12.7 11.5 - 15.5 %   Platelets 362 150 - 400 K/uL   Neutrophils Relative % 57 %   Neutro Abs 4.7 1.7 - 7.7 K/uL   Lymphocytes Relative 33 %   Lymphs Abs 2.8 0.7 - 4.0 K/uL   Monocytes Relative 9 %   Monocytes Absolute 0.8 0.1 - 1.0 K/uL   Eosinophils Relative 1 %   Eosinophils Absolute 0.1 0.0 - 0.7 K/uL   Basophils Relative 0 %   Basophils Absolute 0.0 0.0 - 0.1 K/uL  Urine rapid drug screen (hosp performed)     Status: None   Collection Time: 03/14/17  9:35 PM  Result Value Ref Range   Opiates NONE DETECTED NONE DETECTED   Cocaine NONE DETECTED NONE DETECTED   Benzodiazepines NONE DETECTED NONE DETECTED   Amphetamines NONE DETECTED NONE DETECTED   Tetrahydrocannabinol NONE DETECTED NONE DETECTED   Barbiturates NONE DETECTED NONE DETECTED    Comment:        DRUG SCREEN FOR MEDICAL PURPOSES ONLY.  IF CONFIRMATION IS NEEDED FOR ANY PURPOSE, NOTIFY LAB WITHIN 5 DAYS.        LOWEST DETECTABLE LIMITS FOR URINE DRUG SCREEN Drug Class       Cutoff (ng/mL) Amphetamine      1000 Barbiturate      200 Benzodiazepine   161 Tricyclics       096 Opiates          300 Cocaine          300 THC              50   Urinalysis, Routine w reflex microscopic     Status: Abnormal   Collection Time: 03/14/17  9:35 PM  Result Value Ref Range   Color, Urine STRAW (A) YELLOW   APPearance CLEAR CLEAR   Specific Gravity, Urine 1.005 1.005 - 1.030   pH 5.0 5.0 - 8.0   Glucose, UA NEGATIVE NEGATIVE mg/dL   Hgb urine dipstick NEGATIVE NEGATIVE   Bilirubin Urine NEGATIVE NEGATIVE   Ketones, ur 5 (A) NEGATIVE mg/dL   Protein, ur NEGATIVE NEGATIVE mg/dL   Nitrite NEGATIVE NEGATIVE   Leukocytes, UA NEGATIVE NEGATIVE  Magnesium     Status:  None   Collection Time: 03/14/17 11:30 PM  Result Value Ref Range   Magnesium 2.0  1.7 - 2.4 mg/dL  Phosphorus     Status: None   Collection Time: 03/14/17 11:30 PM  Result Value Ref Range   Phosphorus 4.6 2.5 - 4.6 mg/dL  Basic metabolic panel     Status: Abnormal   Collection Time: 03/15/17  7:12 AM  Result Value Ref Range   Sodium 141 135 - 145 mmol/L   Potassium 3.8 3.5 - 5.1 mmol/L    Comment: DELTA CHECK NOTED   Chloride 110 101 - 111 mmol/L   CO2 21 (L) 22 - 32 mmol/L   Glucose, Bld 97 65 - 99 mg/dL   BUN 19 6 - 20 mg/dL   Creatinine, Ser 1.63 (H) 0.61 - 1.24 mg/dL   Calcium 9.1 8.9 - 10.3 mg/dL   GFR calc non Af Amer 50 (L) >60 mL/min   GFR calc Af Amer 58 (L) >60 mL/min    Comment: (NOTE) The eGFR has been calculated using the CKD EPI equation. This calculation has not been validated in all clinical situations. eGFR's persistently <60 mL/min signify possible Chronic Kidney Disease.    Anion gap 10 5 - 15    Current Facility-Administered Medications  Medication Dose Route Frequency Provider Last Rate Last Dose  . 0.9 % NaCl with KCl 20 mEq/ L  infusion   Intravenous Continuous Reubin Milan, MD      . enoxaparin (LOVENOX) injection 40 mg  40 mg Subcutaneous Q24H Reubin Milan, MD      . fluticasone [facility]) 50 MCG/ACT nasal spray 2 spray  2 spray Each Nare Daily Reubin Milan, MD      . haloperidol (HALDOL) tablet 0.5 mg  0.5 mg Oral BID Sanyiah Kanzler, MD      . levETIRAcetam (KEPPRA XR) 24 hr tablet 1,500 mg  1,500 mg Oral Daily Aroor, Sushanth R, MD      . levETIRAcetam (KEPPRA) tablet 1,000 mg  1,000 mg Oral BID Reubin Milan, MD      . LORazepam (ATIVAN) injection 1 mg  1 mg Intravenous Q4H PRN Reubin Milan, MD      . ondansetron [facility]) tablet 4 mg  4 mg Oral Q6H PRN Reubin Milan, MD       Or  . ondansetron [facility]) injection 4 mg  4 mg Intravenous Q6H PRN Reubin Milan, MD      . zonisamide [facility]) capsule 600 mg  600 mg Oral BID Aroor, Lanice Schwab, MD   600 mg at 03/15/17 0710   Current  Outpatient Prescriptions  Medication Sig Dispense Refill  . Aloe-Sodium Chloride (AYR SALINE NASAL GEL NA) Place 1 application into the nose 2 (two) times daily as needed.    . fluticasone (FLONASE) 50 MCG/ACT nasal spray Place 2 sprays into the nose daily.    Marland Kitchen levETIRAcetam (KEPPRA) 500 MG tablet Take 1,000 mg by mouth 2 (two) times daily.     Marland Kitchen zonisamide (ZONEGRAN) 100 MG capsule Take 100 mg by mouth daily.    Marland Kitchen HYDROcodone-acetaminophen (NORCO) 5-325 MG per tablet Take 1 tablet by mouth every 6 (six) hours as needed for pain. (Patient not taking: Reported on 03/14/2017) 10 tablet 0    Musculoskeletal: Strength & Muscle Tone: within normal limits Gait & Station: normal Patient leans: N/A  Psychiatric Specialty Exam: Physical Exam  Psychiatric: He has a normal mood and affect. His speech is normal and  behavior is normal. Judgment and thought content normal. Cognition and memory are normal.    Review of Systems  Constitutional: Negative.   HENT: Negative.   Eyes: Negative.   Respiratory: Negative.   Cardiovascular: Negative.   Gastrointestinal: Negative.   Genitourinary: Negative.   Musculoskeletal: Negative.   Skin: Negative.   Neurological: Positive for seizures.  Endo/Heme/Allergies: Negative.   Psychiatric/Behavioral: Negative.     Blood pressure 115/78, pulse 62, temperature 98 F (36.7 C), temperature source Oral, resp. rate 12, height 5' 10"  (1.778 m), weight 72.6 kg (160 lb), SpO2 99 %.Body mass index is 22.96 kg/m.  General Appearance: Casual  Eye Contact:  Good  Speech:  Clear and Coherent  Volume:  Normal  Mood:  Euthymic  Affect:  Congruent  Thought Process:  Coherent and Descriptions of Associations: Intact  Orientation:  Full (Time, Place, and Person)  Thought Content:  Logical  Suicidal Thoughts:  No  Homicidal Thoughts:  No  Memory:  Immediate;   Good Recent;   Good Remote;   Good  Judgement:  Intact  Insight:  Fair  Psychomotor Activity:  Normal   Concentration:  Concentration: Fair and Attention Span: Fair  Recall:  [employer] of Knowledge:  Good  Language:  Good  Akathisia:  No  Handed:  Right  AIMS (if indicated):     Assets:  Communication Skills Desire for Improvement Social Support  ADL's:  Intact  Cognition:  WNL  Sleep:   fair     Treatment Plan Summary: Patient with long history of seizure disorder who had multiple episodes of seizures and developed psychosis and paranoia afterward. Patient likely had Post-ictal psychosis but he is now stable after a dose of Haloperidol.  Plan/Recommendation: -Haloperidol 1 mg, 1/2 tablet twice daily for psychosis/delusions. -Patient is cleared by psychiatric service.  Disposition: No evidence of imminent risk to self or others at present.   Patient does not meet criteria for psychiatric inpatient admission. Supportive therapy provided about ongoing stressors.  Corena Pilgrim, MD 03/15/2017 12:44 PM

## 2017-03-15 NOTE — ED Notes (Signed)
First attempt to call report to 14M-7C. Pt and family aware of admit plan.

## 2017-03-15 NOTE — ED Notes (Signed)
ADMITTING DOCTOR AT   THE BEDSIDE 

## 2017-03-16 DIAGNOSIS — Z8249 Family history of ischemic heart disease and other diseases of the circulatory system: Secondary | ICD-10-CM | POA: Diagnosis not present

## 2017-03-16 DIAGNOSIS — F22 Delusional disorders: Secondary | ICD-10-CM

## 2017-03-16 DIAGNOSIS — G40219 Localization-related (focal) (partial) symptomatic epilepsy and epileptic syndromes with complex partial seizures, intractable, without status epilepticus: Secondary | ICD-10-CM | POA: Diagnosis present

## 2017-03-16 DIAGNOSIS — I10 Essential (primary) hypertension: Secondary | ICD-10-CM | POA: Diagnosis present

## 2017-03-16 DIAGNOSIS — F28 Other psychotic disorder not due to a substance or known physiological condition: Secondary | ICD-10-CM

## 2017-03-16 DIAGNOSIS — Z9114 Patient's other noncompliance with medication regimen: Secondary | ICD-10-CM | POA: Diagnosis not present

## 2017-03-16 DIAGNOSIS — N179 Acute kidney failure, unspecified: Secondary | ICD-10-CM | POA: Diagnosis present

## 2017-03-16 DIAGNOSIS — E876 Hypokalemia: Secondary | ICD-10-CM

## 2017-03-16 DIAGNOSIS — K648 Other hemorrhoids: Secondary | ICD-10-CM | POA: Diagnosis present

## 2017-03-16 DIAGNOSIS — M6282 Rhabdomyolysis: Secondary | ICD-10-CM | POA: Diagnosis present

## 2017-03-16 DIAGNOSIS — R569 Unspecified convulsions: Secondary | ICD-10-CM

## 2017-03-16 DIAGNOSIS — Z23 Encounter for immunization: Secondary | ICD-10-CM | POA: Diagnosis not present

## 2017-03-16 LAB — BASIC METABOLIC PANEL
ANION GAP: 10 (ref 5–15)
BUN: 15 mg/dL (ref 6–20)
CHLORIDE: 110 mmol/L (ref 101–111)
CO2: 19 mmol/L — AB (ref 22–32)
Calcium: 9.3 mg/dL (ref 8.9–10.3)
Creatinine, Ser: 1.63 mg/dL — ABNORMAL HIGH (ref 0.61–1.24)
GFR calc Af Amer: 58 mL/min — ABNORMAL LOW (ref >60)
GFR calc non Af Amer: 50 mL/min — ABNORMAL LOW (ref >60)
GLUCOSE: 61 mg/dL — AB (ref 65–99)
POTASSIUM: 3.7 mmol/L (ref 3.5–5.1)
Sodium: 139 mmol/L (ref 135–145)

## 2017-03-16 LAB — HEMOGLOBIN AND HEMATOCRIT, BLOOD
HCT: 38.4 % — ABNORMAL LOW (ref 39.0–52.0)
Hemoglobin: 12.7 g/dL — ABNORMAL LOW (ref 13.0–17.0)

## 2017-03-16 LAB — CK: CK TOTAL: 356 U/L (ref 49–397)

## 2017-03-16 MED ORDER — HYDROCORTISONE ACETATE 25 MG RE SUPP
25.0000 mg | Freq: Two times a day (BID) | RECTAL | Status: DC
  Administered 2017-03-16 – 2017-03-17 (×3): 25 mg via RECTAL
  Filled 2017-03-16 (×4): qty 1

## 2017-03-16 MED ORDER — LEVETIRACETAM 750 MG PO TABS
1500.0000 mg | ORAL_TABLET | Freq: Two times a day (BID) | ORAL | Status: DC
  Administered 2017-03-16 – 2017-03-17 (×2): 1500 mg via ORAL
  Filled 2017-03-16 (×2): qty 2

## 2017-03-16 NOTE — Progress Notes (Signed)
Subjective: Patient is completely back to baseline both per the patient as well as per his wife  Exam: Vitals:   03/16/17 1000 03/16/17 1400  BP: 124/78 118/75  Pulse: 66 72  Resp: 18 16  Temp: 98.2 F (36.8 C) 98.1 F (36.7 C)  SpO2: 99% 99%   Gen: In bed, NAD Resp: non-labored breathing, no acute distress Abd: soft, nt  Neuro: MS: Awake, alert, interactive and appropriate, oriented. CN: Pupils equal round and reactive to light, structure movements intact, face symmetric Motor: 5/5 throughout Sensory: Intact light touch  Impression: 46 year old male with seizures in the setting of medication noncompliance. Due to the difficulty with obtaining his medications, I discussed other cheaper options, but he has tried Depakote and phenytoin in the past with problems. I would be hesitant to try phenobarbital. I would wonder if social work had any other resources for obtaining medications.  Given his return to baseline, I don't think an EEG would be helpful at this time.  Recommendations: 1) social work consult 2) continue home antiepileptic medications. 3) no further recommendations at this time, neurology will sign off. If further questions regarding medication changes arise then please call.  Roland Rack, MD [facility] [phone]  If 7pm- 7am, please page neurology on call as listed in [place].

## 2017-03-16 NOTE — Progress Notes (Addendum)
[facility]                                                                              Patient Demographics  Omar Payne, is a 46 y.o. male, DOB - 1971/01/17, WRU:[id number]  Admit date - 03/14/2017   Admitting Physician Reubin Milan, MD  Outpatient Primary MD for the patient is Patient, No Pcp Per  Outpatient specialists:   LOS - 0  days   Medical records reviewed and are as summarized below:    Chief Complaint  Patient presents with  . Seizures       Brief summary   Patient is a 46 year old male with history of seizures, right temporal lobe lobectomy s/p VNS admitted on 9/28 presented with complaint of seizures, hallucinations. He was found to have seizures likely from noncompliance  (rationing Keppra due to lack of insurance), postictal psychosis. Patient was transferred to [facility] for further workup.   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 Mitzi Hansen Evans/neurology at [facility].  - per care everywhere, patient is supposed to be on zonisamide 600 mg daily, Keppra 1500mg  BID. However patient was taking half of 750mg  tab= 375mg  daily only since September 3rd. The patient reports that he lost his insurance and hence was rationing his seizure medications until his disability came through.  - Patient was seen by neurology recommended 1 g of Keppra loading dose and resumed home dose of zonisamide and Keppra. Discussed with neurology, Dr. Leonel Ramsay recommended Keppra IR 1500 mg twice a day or 1000 mg TID which is cheaper than XR version - case management consult for possible MATCH program  - EEG pending  Active Problems:   Hypokalemia - recheck BMET    AKI (acute kidney injury) ([place]) - the patient presented with creatinine of 1.76 likely due to rhabdomyolysis from seizures -  Continue IV fluid hydration, creatinine improving, recheck BMET today    Post-ictal confusion, Psychosis ([place]) - patient was seen by psychiatry and determined that he had likely postictal psychosis - Patient was placed on haloperidol 0.5 mg twice daily for psychosis and was cleared by psychiatry service  Rectal bleed - called by RN, BRBPR, H&H is currently stable, patient has a history of internal hemorrhoids and had seen Dr. Virgina Organ, gastroenterology in [facility]. - Discussed with Dr. Michail Sermon, gastroenterology on call, the patient is currently stable, not able to do colonoscopy till Tuesday unless perfusely, hemodynamically unstable. Recommended outpatient colonoscopy with his own gastroenterologist or Dr. Michail Sermon. - DC Lovenox, placed on SCDs - placed on Anusol suppositories  Code Status:full CODE STATUS DVT Prophylaxis:  Discontinued Lovenox, placed on SCDs Family Communication: Discussed in detail with the patient, all imaging results, lab results explained to the patient and wife at the bedside   Disposition Plan :likely in a.m.  Time Spent in minutes   25 minutes  Procedures:  none  Consultants:   neurology  Antimicrobials:      Medications  Scheduled Meds: . enoxaparin (LOVENOX)  injection  40 mg Subcutaneous Q24H  . fluticasone  2 spray Each Nare Daily  . haloperidol  0.5 mg Oral BID  . levETIRAcetam  1,000 mg Oral BID  . zonisamide  600 mg Oral BID   Continuous Infusions: . 0.9 % NaCl with KCl 20 mEq / L Stopped (03/15/17 2050)   PRN Meds:.LORazepam, ondansetron **OR** ondansetron (ZOFRAN) IV   Antibiotics   Anti-infectives    None        Subjective:   Omar Payne was seen and examined today.  Somewhat emotional and tearful, no new seizures overnight. No fevers or chills. Patient denies dizziness, chest pain, shortness of breath, abdominal pain, N/V/D/C, new weakness, numbess, tingling. No acute events overnight.    Objective:   Vitals:     03/15/17 2113 03/16/17 0201 03/16/17 0545 03/16/17 1000  BP: 116/76 112/67 110/70 124/78  Pulse: 63 74 74 66  Resp: 18 18 18 18   Temp: 97.9 F (36.6 C) 97.9 F (36.6 C) 97.9 F (36.6 C) 98.2 F (36.8 C)  TempSrc: Oral Oral Oral Oral  SpO2: 98% 100% 99% 99%  Weight:      Height:        Intake/Output Summary (Last 24 hours) at 03/16/17 1135 Last data filed at 03/15/17 2032  Gross per 24 hour  Intake           127.08 ml  Output                2 ml  Net           125.08 ml     Wt Readings from Last 3 Encounters:  03/14/17 72.6 kg (160 lb)     Exam  General: Alert and oriented x 3, NAD, anxious and tearful  Eyes:   HEENT:  Atraumatic, normocephalic  Cardiovascular: S1 S2 auscultated, no rubs, murmurs or gallops. Regular rate and rhythm.  Respiratory: Clear to auscultation bilaterally, no wheezing, rales or rhonchi  Gastrointestinal: Soft, nontender, nondistended, + bowel sounds  Ext: no pedal edema bilaterally  Neuro: AAOx3, Cr N's II- XII. Strength 5/5 upper and lower extremities bilaterally, speech clear, sensations grossly intact  Musculoskeletal: No digital cyanosis, clubbing  Skin: No rashes  Psych:anxious and tearful, alert and oriented x3    Data Reviewed:  I have personally reviewed following labs and imaging studies  Micro Results No results found for this or any previous visit (from the past 240 hour(s)).  Radiology Reports Dg Chest 2 View  Result Date: 03/14/2017 CLINICAL DATA:  Seizures EXAM: CHEST  2 VIEW COMPARISON:  Chest radiograph 06/16/2015 FINDINGS: Position of left chest wall stimulator generator is unchanged. The associated lead terminates at the level of the C6 vertebra. The heart size and mediastinal contours are within normal limits. Both lungs are clear. The visualized skeletal structures are unremarkable. IMPRESSION: No active cardiopulmonary disease. Electronically Signed   By: Ulyses Jarred M.D.   On: 03/14/2017 20:24   Ct Head  Wo Contrast  Result Date: 03/14/2017 CLINICAL DATA:  Hallucinations and altered mental status. EXAM: CT HEAD WITHOUT CONTRAST TECHNIQUE: Contiguous axial images were obtained from the base of the skull through the vertex without intravenous contrast. COMPARISON:  Head CT 01/22/2013 FINDINGS: Brain: No mass lesion, intraparenchymal hemorrhage or extra-axial collection. No evidence of acute cortical infarct. Status post right temporal lobectomy. Otherwise normal appearance of the brain parenchyma. Vascular: No hyperdense vessel or unexpected calcification. Skull: Remote right frontotemporal craniotomy. Sinuses/Orbits: No sinus fluid levels or advanced mucosal  thickening. No mastoid effusion. Normal orbits. IMPRESSION: Status post remote right temporal lobectomy. Otherwise normal appearance of the brain. Electronically Signed   By: Ulyses Jarred M.D.   On: 03/14/2017 20:31    Lab Data:  CBC:  Recent Labs Lab 03/14/17 2039  WBC 8.4  NEUTROABS 4.7  HGB 14.1  HCT 40.5  MCV 84.7  PLT 578   Basic Metabolic Panel:  Recent Labs Lab 03/14/17 2039 03/14/17 2330 03/15/17 0712  NA 139  --  141  K 3.1*  --  3.8  CL 106  --  110  CO2 21*  --  21*  GLUCOSE 95  --  97  BUN 22*  --  19  CREATININE 1.76*  --  1.63*  CALCIUM 9.4  --  9.1  MG  --  2.0  --   PHOS  --  4.6  --    GFR: Estimated Creatinine Clearance: 59.4 mL/min (A) (by C-G formula based on SCr of 1.63 mg/dL (H)). Liver Function Tests:  Recent Labs Lab 03/14/17 2039  AST 36  ALT 26  ALKPHOS 81  BILITOT 1.1  PROT 7.3  ALBUMIN 4.7   No results for input(s): LIPASE, AMYLASE in the last 168 hours. No results for input(s): AMMONIA in the last 168 hours. Coagulation Profile: No results for input(s): INR, PROTIME in the last 168 hours. Cardiac Enzymes:  Recent Labs Lab 03/16/17 0842  CKTOTAL 356   BNP (last 3 results) No results for input(s): PROBNP in the last 8760 hours. HbA1C: No results for input(s): HGBA1C in the  last 72 hours. CBG: No results for input(s): GLUCAP in the last 168 hours. Lipid Profile: No results for input(s): CHOL, HDL, LDLCALC, TRIG, CHOLHDL, LDLDIRECT in the last 72 hours. Thyroid Function Tests: No results for input(s): TSH, T4TOTAL, FREET4, T3FREE, THYROIDAB in the last 72 hours. Anemia Panel: No results for input(s): VITAMINB12, FOLATE, FERRITIN, TIBC, IRON, RETICCTPCT in the last 72 hours. Urine analysis:    Component Value Date/Time   COLORURINE STRAW (A) 03/14/2017 2135   APPEARANCEUR CLEAR 03/14/2017 2135   LABSPEC 1.005 03/14/2017 2135   PHURINE 5.0 03/14/2017 2135   GLUCOSEU NEGATIVE 03/14/2017 2135   HGBUR NEGATIVE 03/14/2017 2135   BILIRUBINUR NEGATIVE 03/14/2017 2135   KETONESUR 5 (A) 03/14/2017 2135   PROTEINUR NEGATIVE 03/14/2017 2135   UROBILINOGEN 1.0 01/22/2013 1517   NITRITE NEGATIVE 03/14/2017 2135   LEUKOCYTESUR NEGATIVE 03/14/2017 2135     Sindy Mccune M.D. [facility] 03/16/2017, 11:35 AM  Pager: [phone] Between 7am to 7pm - call Pager - [phone]  After 7pm go to [email or web address] - password [email or web address]  Call night coverage person covering after 7pm

## 2017-03-17 LAB — BASIC METABOLIC PANEL
Anion gap: 6 (ref 5–15)
BUN: 13 mg/dL (ref 6–20)
CALCIUM: 8.7 mg/dL — AB (ref 8.9–10.3)
CO2: 19 mmol/L — AB (ref 22–32)
CREATININE: 1.28 mg/dL — AB (ref 0.61–1.24)
Chloride: 114 mmol/L — ABNORMAL HIGH (ref 101–111)
GFR calc non Af Amer: >60 mL/min (ref >60)
Glucose, Bld: 98 mg/dL (ref 65–99)
Potassium: 3.9 mmol/L (ref 3.5–5.1)
SODIUM: 139 mmol/L (ref 135–145)

## 2017-03-17 MED ORDER — INFLUENZA VAC SPLIT QUAD 0.5 ML IM SUSY
0.5000 mL | PREFILLED_SYRINGE | INTRAMUSCULAR | Status: AC
  Administered 2017-03-17: 0.5 mL via INTRAMUSCULAR
  Filled 2017-03-17: qty 0.5

## 2017-03-17 MED ORDER — HYDROCORTISONE ACETATE 25 MG RE SUPP: 25.0000 mg | Freq: Two times a day (BID) | RECTAL | 1 refills | Status: DC

## 2017-03-17 MED ORDER — HALOPERIDOL 0.5 MG PO TABS: 0.5000 mg | ORAL_TABLET | Freq: Two times a day (BID) | ORAL | 0 refills | Status: DC

## 2017-03-17 MED ORDER — ZONISAMIDE 100 MG PO CAPS: 600.0000 mg | ORAL_CAPSULE | Freq: Two times a day (BID) | ORAL | 1 refills | Status: DC

## 2017-03-17 MED ORDER — LEVETIRACETAM 750 MG PO TABS: 1500.0000 mg | ORAL_TABLET | Freq: Two times a day (BID) | ORAL | 2 refills | Status: DC

## 2017-03-17 NOTE — Discharge Instructions (Signed)
    Epilepsy Epilepsy is when a person keeps having seizures. A seizure is unusual activity in the brain. A seizure can change how you think or behave, and it can make it hard to be aware of what is happening. This condition can cause problems, such as:  Falls, accidents, and injury.  Depression.  Poor memory.  Sudden unexplained death in epilepsy (SUDEP). This is rare. Its cause is not known. Most people with epilepsy lead normal lives. Follow these instructions at home: Medicines  Take medicines only as told by your doctor.  Avoid anything that may keep your medicine from working, such as alcohol. Activity  Get enough rest. Lack of sleep can make seizures more likely to occur.  Follow your doctor's advice about driving, swimming, and doing anything else that would be dangerous if you had a seizure. Teaching others  Teach friends and family what to do if you have a seizure. They should:  Lay you on the ground to prevent a fall.  Cushion your head and body.  Loosen any tight clothing around your neck.  Turn you on your side.  Stay with you until you are better.  Not hold you down.  Not put anything in your mouth.  Know whether or not you need emergency care. General instructions  Avoid anything that causes you to have seizures.  Keep a seizure diary. Write down what you remember about each seizure, and especially what might have caused it.  Keep all follow-up visits as told by your doctor. This is important. Contact a doctor if:  You have a change in your seizure pattern.  You get an infection or start to feel sick. You may have more seizures when you are sick. Get help right away if:  A seizure does not stop after 5 minutes.  You have more than one seizure in a row, and you do not have enough time between the seizures to feel better.  A seizure makes it harder to breathe.  A seizure is different from other seizures you have had.  A seizure makes you  unable to speak or use a part of your body.  You did not wake up right after a seizure. This information is not intended to replace advice given to you by your health care provider. Make sure you discuss any questions you have with your health care provider. Document Released: 03/31/2009 Document Revised: 01/08/2016 Document Reviewed: 12/12/2015 Elsevier Interactive Patient Education  2017 [employer]  

## 2017-03-17 NOTE — Progress Notes (Signed)
Patient left unit by wheelchair, accompanied by staff.  All belongings with patient.  Discharge instructions given, all questions and concerns addressed.

## 2017-03-17 NOTE — Care Management Note (Signed)
Case Management Note  Patient Details  Name: Omar Payne MRN: [id number] Date of Birth: Feb 09, 1971  Subjective/Objective:     Pt in with seizures. He is from home with his spouse. Pt has no insurance and no PCP. He has been noncompliant with his medications d/t cost at home.                Action/Plan: CM consulted for medication assistance. CM met with the patient and his wife. CM inquired about attending one of the [place]. Pt was interested. CM was able to obtain him an appointment at the [facility] Information on the AVS. CM also went over the use of the pharmacy at the [facility] for assistance with the cost of his medications. CM verified with [facility] pharmacy that they carry his seizure medications. They can also provide his Haldol,with notice because they have to order this medication. Pt updated.  Today CM provided him a [facility] letter to assist with the the cost of his medications and a list of the pharmacies that honor the [facility] letter.  Wife to provide transportation home today. Bedside RN updated.   Expected Discharge Date:  03/17/17               Expected Discharge Plan:  Home/Self Care  In-House Referral:     Discharge planning Services  CM Consult, [facility], Medication Assistance, [facility] Program  Post Acute Care Choice:    Choice offered to:     DME Arranged:    DME Agency:     HH Arranged:    HH Agency:     Status of Service:  Completed, signed off  If discussed at [employer], dates discussed:    Additional Comments:  Pollie Friar, RN 03/17/2017, 11:24 AM

## 2017-03-17 NOTE — Discharge Summary (Signed)
Physician Discharge Summary   Patient ID: Omar Payne MRN: [id number] DOB/AGE: 11-17-1970 46 y.o.  Admit date: 03/14/2017 Discharge date: 03/17/2017  Primary Care Physician:  Chesley Noon, MD  Discharge Diagnoses:      Seizure, breakthrough . Hypokalemia . AKI (acute kidney injury) (HCC)with rhabdomyolysis from seizure . Post-ictal confusion . Psychosis ([place])   Hemorrhoidal rectal bleed   Consults: neurology  Recommendations for Outpatient Follow-up:  1. Patient was recommended to follow up with gastroenterology or outpatient colonoscopy, patient preferred to follow-up with his own GI, Dr.Lenin Peters  2. Please repeat CBC/BMET at next visit 3. Patient may be weaned off of Haldol at the follow-up or refer to outpatient psychiatry  DIET: heart healthy diet    Allergies:  No Known Allergies   DISCHARGE MEDICATIONS: Current Discharge Medication List    START taking these medications   Details  haloperidol (HALDOL) 0.5 MG tablet Take 1 tablet (0.5 mg total) by mouth 2 (two) times daily. Qty: 60 tablet, Refills: 0    hydrocortisone (ANUSOL-HC) 25 MG suppository Place 1 suppository (25 mg total) rectally 2 (two) times daily. Qty: 60 suppository, Refills: 1      CONTINUE these medications which have CHANGED   Details  levETIRAcetam (KEPPRA) 750 MG tablet Take 2 tablets (1,500 mg total) by mouth 2 (two) times daily. Please give immediate release version/cheaper Qty: 120 tablet, Refills: 2    zonisamide (ZONEGRAN) 100 MG capsule Take 6 capsules (600 mg total) by mouth 2 (two) times daily. Qty: 240 capsule, Refills: 1      CONTINUE these medications which have NOT CHANGED   Details  Aloe-Sodium Chloride (AYR SALINE NASAL GEL NA) Place 1 application into the nose 2 (two) times daily as needed.    fluticasone (FLONASE) 50 MCG/ACT nasal spray Place 2 sprays into the nose daily.      STOP taking these medications     HYDROcodone-acetaminophen (NORCO) 5-325 MG  per tablet          Brief H and P: For complete details please refer to admission H and P, but in brief  Patient is a 46 year old male with history of seizures, right temporal lobe lobectomy s/p VNS admitted on 9/28 presented with complaint of seizures, hallucinations. He was found to have seizures likely from noncompliance  (rationing Keppra due to lack of insurance), postictal psychosis. Patient was transferred to [facility] for further workup.  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 Mitzi Hansen Evans/neurology at [facility].  - per care everywhere, patient is supposed to be on zonisamide 600 mg daily, Keppra 1500mg  BID. However patient was taking half of 750mg  tab= 375mg  daily only since September 3rd. The patient reports that he lost his insurance and hence was rationing his seizure medications until his disability came through.  - Patient was seen by neurology recommended 1 g of Keppra loading dose and resumed home dose of zonisamide and Keppra. Discussed with neurology, Dr. Leonel Ramsay recommended Keppra IR 1500 mg twice a day or 1000 mg TID which is cheaper than XR version at [facility]. Discussed with the patient. - case management consulted for possible MATCH program  - per neurology,given his return to baseline, EEG would not be helpful at this time.  - Patient recommended to follow-up with his neurologist, Dr. 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 Virgina Organ, gastroenterology in [facility] - Discussed with Dr. Michail Sermon, gastroenterology on call, the patient is currently stable, not able to do colonoscopy till Tuesday unless perfusely, hemodynamically unstable. Recommended outpatient colonoscopy with his own gastroenterologist or Dr. Michail Sermon. - placed on Anusol suppositories. Patient prefers to follow-up with his own gastroenterologist, Dr Virgel Bouquet    Day of Discharge BP 125/83 (BP Location: Left Arm)   Pulse 71   Temp 98.1 F (36.7 C) (Oral)   Resp 20   Ht 5\' 10"  (1.778 m)   Wt 72.6 kg (160 lb)   SpO2 100%   BMI 22.96 kg/m   Physical Exam: General: Alert and awake oriented x3 not in any acute distress. HEENT: anicteric sclera, pupils reactive to light and accommodation CVS: S1-S2 clear no murmur rubs or gallops Chest: clear to auscultation bilaterally, no wheezing rales or rhonchi Abdomen: soft nontender, nondistended, normal bowel sounds Extremities: no cyanosis, clubbing or edema noted bilaterally Neuro: Cranial nerves II-XII intact, no focal neurological deficits   The results of significant diagnostics from this hospitalization (including imaging, microbiology, ancillary and laboratory) are listed below for reference.    LAB RESULTS: Basic Metabolic Panel:  Recent Labs Lab 03/14/17 2330  03/16/17 1152 03/17/17 0433  NA  --   < > 139 139  K  --   < > 3.7 3.9  CL  --   < > 110 114*  CO2  --   < > 19* 19*  GLUCOSE  --   < > 61* 98  BUN  --   < > 15 13  CREATININE  --   < > 1.63* 1.28*  CALCIUM  --   < > 9.3 8.7*  MG 2.0  --   --   --   PHOS 4.6  --   --   --   < > = values in this interval not displayed. Liver Function Tests:  Recent Labs Lab 03/14/17 2039  AST 36  ALT 26  ALKPHOS 81   BILITOT 1.1  PROT 7.3  ALBUMIN 4.7   No results for input(s): LIPASE, AMYLASE in the last 168 hours. No results for input(s): AMMONIA in the last 168 hours. CBC:  Recent Labs Lab 03/14/17 2039 03/16/17 1323  WBC 8.4  --   NEUTROABS 4.7  --   HGB 14.1 12.7*  HCT 40.5 38.4*  MCV 84.7  --   PLT 362  --    Cardiac Enzymes:  Recent Labs Lab 03/16/17 0842  CKTOTAL 356   BNP: Invalid input(s): POCBNP CBG: No results for input(s): GLUCAP in the last 168 hours.  Significant Diagnostic Studies:  Dg Chest 2 View  Result Date: 03/14/2017 CLINICAL DATA:  Seizures EXAM: CHEST  2 VIEW COMPARISON:  Chest radiograph 06/16/2015 FINDINGS: Position of left chest wall stimulator generator is unchanged. The associated lead terminates at the level of the C6 vertebra. The heart size  and mediastinal contours are within normal limits. Both lungs are clear. The visualized skeletal structures are unremarkable. IMPRESSION: No active cardiopulmonary disease. Electronically Signed   By: Ulyses Jarred M.D.   On: 03/14/2017 20:24   Ct Head Wo Contrast  Result Date: 03/14/2017 CLINICAL DATA:  Hallucinations and altered mental status. EXAM: CT HEAD WITHOUT CONTRAST TECHNIQUE: Contiguous axial images were obtained from the base of the skull through the vertex without intravenous contrast. COMPARISON:  Head CT 01/22/2013 FINDINGS: Brain: No mass lesion, intraparenchymal hemorrhage or extra-axial collection. No evidence of acute cortical infarct. Status post right temporal lobectomy. Otherwise normal appearance of the brain parenchyma. Vascular: No hyperdense vessel or unexpected calcification. Skull: Remote right frontotemporal craniotomy. Sinuses/Orbits: No sinus fluid levels or advanced mucosal thickening. No mastoid effusion. Normal orbits. IMPRESSION: Status post remote right temporal lobectomy. Otherwise normal appearance of the brain. Electronically Signed   By: Ulyses Jarred M.D.   On: 03/14/2017 20:31     2D ECHO:   Disposition and Follow-up: Discharge Instructions    Diet - low sodium heart healthy    Complete by:  As directed    Increase activity slowly    Complete by:  As directed        DISPOSITION:heart healthy diet   DISCHARGE FOLLOW-UP Follow-up Information    Chesley Noon, MD. Schedule an appointment as soon as possible for a visit in 2 week(s).   Specialty:  Family Medicine Contact information: [place] [address] [phone]        Ernest Haber. Schedule an appointment as soon as possible for a visit in 2 week(s).   Specialty:  Neurology Contact information: [place] [address] [phone]        Gomez Cleverly. Schedule an appointment as soon as possible for a visit in 2 week(s).   Specialty:  Unknown Physician Specialty Why:  gastroeneterology Contact information: [place] [address] [phone]            Time spent on Discharge: 55mins   Signed:   Estill Cotta M.D. [facility] 03/17/2017, 11:00 AM Pager: [phone]

## 2017-03-28 ENCOUNTER — Inpatient Hospital Stay: Payer: Self-pay

## 2017-04-04 ENCOUNTER — Ambulatory Visit: Payer: Medicaid Other | Attending: Family Medicine | Admitting: Physician Assistant

## 2017-04-04 VITALS — BP 90/60 | HR 72 | Temp 98.4°F | Resp 18 | Ht 69.0 in | Wt 165.0 lb

## 2017-04-04 DIAGNOSIS — G40909 Epilepsy, unspecified, not intractable, without status epilepticus: Secondary | ICD-10-CM | POA: Insufficient documentation

## 2017-04-04 DIAGNOSIS — Z8249 Family history of ischemic heart disease and other diseases of the circulatory system: Secondary | ICD-10-CM | POA: Insufficient documentation

## 2017-04-04 DIAGNOSIS — F29 Unspecified psychosis not due to a substance or known physiological condition: Secondary | ICD-10-CM | POA: Insufficient documentation

## 2017-04-04 DIAGNOSIS — R569 Unspecified convulsions: Secondary | ICD-10-CM

## 2017-04-04 DIAGNOSIS — Z9089 Acquired absence of other organs: Secondary | ICD-10-CM | POA: Insufficient documentation

## 2017-04-04 DIAGNOSIS — Z818 Family history of other mental and behavioral disorders: Secondary | ICD-10-CM | POA: Diagnosis not present

## 2017-04-04 DIAGNOSIS — Z9889 Other specified postprocedural states: Secondary | ICD-10-CM | POA: Insufficient documentation

## 2017-04-04 DIAGNOSIS — N179 Acute kidney failure, unspecified: Secondary | ICD-10-CM

## 2017-04-04 DIAGNOSIS — Z79899 Other long term (current) drug therapy: Secondary | ICD-10-CM | POA: Diagnosis not present

## 2017-04-04 MED ORDER — LEVETIRACETAM 750 MG PO TABS: 1500.0000 mg | ORAL_TABLET | Freq: Two times a day (BID) | ORAL | 2 refills | Status: DC

## 2017-04-04 MED ORDER — HALOPERIDOL 0.5 MG PO TABS: ORAL_TABLET | ORAL | 0 refills | Status: DC

## 2017-04-04 MED ORDER — ZONISAMIDE 100 MG PO CAPS: 600.0000 mg | ORAL_CAPSULE | Freq: Two times a day (BID) | ORAL | 1 refills | Status: DC

## 2017-04-04 NOTE — Patient Instructions (Signed)
Keep appointment with Dr. Amalia Hailey Avoid triggers No driving Continue all medications as prescribed We do not have brand named Keppra here

## 2017-04-04 NOTE — Progress Notes (Signed)
Omar Payne  QQI:[id number]  HER:[id number]  DOB - 22-Nov-1970  Chief Complaint  Patient presents with  . Hospitalization Follow-up       Subjective:   Omar Payne is a 46 y.o. male here today for establishment of care. 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 He had been rationing out his medications secondary to cost and financial constraints. He was loaded with Keppra and started back on his home dose with improvement of symptoms. He was noted to have acute kidney injury likely secondary to rhabdomyolysis from his seizures. This improved with hydration. All of his imaging studies were within normal limits. His urine drug screen was within normal limits as well. Given his psychosis he was started on Haldol.  He followed up with his neurologist on 03/19/2017 and has been told to continue his chronic seizure medications. He was told that he can reduce his Haldol 2.25 mg twice daily with anticipation of quitting over the next month. He has follow-up in 2 weeks with his neurologist.  He does not drive since his seizure disorder. Compliant with his medications now. Nonsmoker. No alcohol. Very concerned about his finances. No more seizures. Intermittent headaches but no worse than usual. No blurred vision. No blackouts. No palpitations or chest pain.  ROS: GEN: denies fever or chills, denies change in weight Skin: denies lesions or rashes HEENT: denies headache, earache, epistaxis, sore throat, or neck pain LUNGS: denies SHOB, dyspnea, PND, orthopnea CV: denies CP or palpitations ABD: denies abd pain, N or V EXT: denies muscle spasms or swelling; no pain in lower ext, no weakness NEURO: denies numbness or tingling, + sz, stroke  or TIA  ALLERGIES: No Known Allergies  PAST MEDICAL HISTORY: Past Medical History:  Diagnosis Date  . Localization-related (focal) (partial) symptomatic epilepsy and epileptic syndromes with complex partial seizures, intractable, without status epilepticus ([place])     PAST SURGICAL HISTORY: Past Surgical History:  Procedure Laterality Date  . BRAIN SURGERY     right temporal lobectomy  . VNS     VNS implant    MEDICATIONS AT HOME: Prior to Admission medications   Medication Sig Start Date End Date Taking? Authorizing Provider  haloperidol (HALDOL) 0.5 MG tablet Take 1/2 tablet 04/04/17  Yes Delma Villalva S, PA-C  levETIRAcetam (KEPPRA) 750 MG tablet Take 2 tablets (1,500 mg total) by mouth 2 (two) times daily. Please give immediate release version/cheaper 04/04/17  Yes Ena Dawley, Osmin Welz S, PA-C  zonisamide (ZONEGRAN) 100 MG capsule Take 6 capsules (600 mg total) by mouth 2 (two) times daily. 04/04/17  Yes Rudean Icenhour S, PA-C  Aloe-Sodium Chloride (AYR SALINE NASAL GEL NA) Place 1 application into the nose 2 (two) times daily as needed.    [provider]  fluticasone (FLONASE) 50 MCG/ACT nasal spray Place 2 sprays into the nose daily. 01/30/17 01/30/18  [provider]  hydrocortisone (ANUSOL-HC) 25 MG suppository Place 1 suppository (25 mg total) rectally 2 (two) times daily. Patient not taking: Reported on 04/04/2017 03/17/17   Mendel Corning, MD    Family History  Problem Relation Age of Onset  . Hypertension Father   . Bipolar disorder Brother    Social-married, children, unemployed-working on disability  Objective:   Vitals:   04/04/17 1338  BP:  90/60  Pulse: 72  Resp: 18  Temp: 98.4 F (36.9 C)  TempSrc: Oral  SpO2: 99%  Weight: 165 lb (74.8 kg)  Height: 5\' 9"  (1.753 m)    Exam General appearance : Awake, alert, not in any distress. Speech Clear. Not toxic looking HEENT: Atraumatic and Normocephalic, pupils equally reactive to light and  accomodation Neck: supple, no JVD. No cervical lymphadenopathy.  Chest:Good air entry bilaterally, no added sounds  CVS: S1 S2 regular, no murmurs.  Abdomen: Bowel sounds present, Non tender and not distended with no guarding, rigidity or rebound. Extremities: B/L Lower Ext shows no edema, both legs are warm to touch Neurology: Awake alert, and oriented X 3, CN II-XII intact, Non focal Skin:No Rash Wounds:N/A  Data Review No results found for: HGBA1C   Assessment & Plan  1. Seizure D/O  -Cont Keppra and Zonegran  -No driving  -keep appt with Neurology in 2 weeks  -avoid triggers 2. Psychosis  -Haldol being weaned    Return in about 4 weeks (around 05/02/2017).  The patient was given clear instructions to go to ER or return to medical center if symptoms don't improve, worsen or new problems develop. The patient verbalized understanding. The patient was told to call to get lab results if they haven't heard anything in the next week.   Total time spent with patient was 31 min. Greater than 50 % of this visit was spent face to face counseling and coordinating care regarding risk factor modification, compliance importance and encouragement, education related to his medications/seizures.  This note has been created with Surveyor, quantity. Any transcriptional errors are unintentional.    Zettie Pho, PA-C [facility] [place], [place]   04/04/2017, 2:26 PM

## 2017-04-04 NOTE — Progress Notes (Signed)
Patient is here for hospital f/up

## 2017-04-05 LAB — BASIC METABOLIC PANEL
BUN/Creatinine Ratio: 16 (ref 9–20)
BUN: 18 mg/dL (ref 6–24)
CALCIUM: 9.4 mg/dL (ref 8.7–10.2)
CO2: 19 mmol/L — AB (ref 20–29)
CREATININE: 1.12 mg/dL (ref 0.76–1.27)
Chloride: 107 mmol/L — ABNORMAL HIGH (ref 96–106)
GFR calc Af Amer: 92 mL/min/{1.73_m2} (ref >59)
GFR, EST NON AFRICAN AMERICAN: 79 mL/min/{1.73_m2} (ref >59)
Glucose: 80 mg/dL (ref 65–99)
Potassium: 4.3 mmol/L (ref 3.5–5.2)
SODIUM: 141 mmol/L (ref 134–144)

## 2017-04-07 ENCOUNTER — Other Ambulatory Visit: Payer: Self-pay | Admitting: Internal Medicine

## 2017-04-07 ENCOUNTER — Telehealth: Payer: Self-pay | Admitting: Pharmacist

## 2017-04-07 MED ORDER — ZONISAMIDE 100 MG PO CAPS: 600.0000 mg | ORAL_CAPSULE | Freq: Every day | ORAL | 2 refills | Status: DC

## 2017-04-07 NOTE — Telephone Encounter (Signed)
Received message from the pharmacy, haloperidol 0.5 mg tablets is on backorder and that is the lowest dose available. Patient is aware. Will fax patient's neurologist who determined the dose for a new prescription.

## 2017-04-23 ENCOUNTER — Encounter: Payer: Self-pay | Admitting: Family Medicine

## 2017-04-23 ENCOUNTER — Ambulatory Visit: Payer: Medicaid Other | Attending: Family Medicine | Admitting: Family Medicine

## 2017-04-23 VITALS — BP 117/77 | HR 66 | Temp 98.1°F | Resp 18 | Ht 69.0 in | Wt 166.6 lb

## 2017-04-23 DIAGNOSIS — B351 Tinea unguium: Secondary | ICD-10-CM | POA: Diagnosis not present

## 2017-04-23 DIAGNOSIS — Z79899 Other long term (current) drug therapy: Secondary | ICD-10-CM | POA: Insufficient documentation

## 2017-04-23 DIAGNOSIS — G40909 Epilepsy, unspecified, not intractable, without status epilepticus: Secondary | ICD-10-CM | POA: Insufficient documentation

## 2017-04-23 DIAGNOSIS — Z09 Encounter for follow-up examination after completed treatment for conditions other than malignant neoplasm: Secondary | ICD-10-CM

## 2017-04-23 DIAGNOSIS — Z23 Encounter for immunization: Secondary | ICD-10-CM

## 2017-04-23 DIAGNOSIS — Z5189 Encounter for other specified aftercare: Secondary | ICD-10-CM | POA: Insufficient documentation

## 2017-04-23 MED ORDER — CICLOPIROX 8 % EX SOLN: Freq: Every day | CUTANEOUS | 1 refills | Status: DC

## 2017-04-23 NOTE — Progress Notes (Signed)
Patient is here for f/up  

## 2017-04-23 NOTE — Progress Notes (Signed)
   Subjective:  Patient ID: Omar Payne, male    DOB: 11/25/1970  Age: 46 y.o. MRN: [id number]  CC: Follow-up   HPI Omar Payne presents for follow up. PMH of seizure disorder with VNS. He reports history of partial complex-simple somatic seizures. He denies any seizure activity since last office visit. He is under the care of neurologist. Reports follow up with neurologist yesterday, and  to wean off Haldol and discontinue on Tuesday. Neurology visit yesterday. He reports working for an [employer] in the past but had discontinue due to seizure disorder. He is currently in the process of filing for disability. Toenail complaint: Discoloration to 4th digit of right toe. Onset a few months ago. Reports using OTC antifungal  athletes foot creams for symptoms with no relief.     Outpatient Medications Prior to Visit  Medication Sig Dispense Refill  . Aloe-Sodium Chloride (AYR SALINE NASAL GEL NA) Place 1 application into the nose 2 (two) times daily as needed.    . fluticasone (FLONASE) 50 MCG/ACT nasal spray Place 2 sprays into the nose daily.    . haloperidol (HALDOL) 0.5 MG tablet Take 1/2 tablet 30 tablet 0  . hydrocortisone (ANUSOL-HC) 25 MG suppository Place 1 suppository (25 mg total) rectally 2 (two) times daily. 60 suppository 1  . levETIRAcetam (KEPPRA) 750 MG tablet Take 2 tablets (1,500 mg total) by mouth 2 (two) times daily. Please give immediate release version/cheaper 120 tablet 2  . zonisamide (ZONEGRAN) 100 MG capsule Take 6 capsules (600 mg total) by mouth daily. 360 capsule 2   No facility-administered medications prior to visit.     ROS Review of Systems  Constitutional: Negative.   Respiratory: Negative.   Cardiovascular: Negative.   Skin: Positive for color change (toenail).  Neurological:       History of seizure disorder    Objective:  BP 117/77 (BP Location: Left Arm, Patient Position: Sitting, Cuff Size: Normal)   Pulse 66   Temp 98.1 F (36.7 C)  (Oral)   Resp 18   Ht 5\' 9"  (1.753 m)   Wt 166 lb 9.6 oz (75.6 kg)   BMI 24.60 kg/m   BP/Weight 04/23/2017 04/04/2017 97/02/8920  Systolic BP 194 90 174  Diastolic BP 77 60 83  Wt. (Lbs) 166.6 165 -  BMI 24.6 24.37 -    Physical Exam  Constitutional: He appears well-developed and well-nourished.  Cardiovascular: Normal rate, regular rhythm, normal heart sounds and intact distal pulses.  Pulmonary/Chest: Effort normal and breath sounds normal.  Skin: Skin is warm and dry.  Right foot 4th digit: yellow discoloration to lateral nailbed of 4th , no thickening of nail present.   Nursing note and vitals reviewed.    Assessment & Plan:   1. Follow up Per last hospital d/c rec.outpatient colonoscopy. - Ambulatory referral to Gastroenterology  2. Onychomycosis  - ciclopirox (PENLAC) 8 % solution; Apply at bedtime topically. Apply over nail and surrounding skin. Apply daily over previous coat. After seven (7) days, may remove with alcohol and continue cycle.  Dispense: 6.6 mL; Refill: 1  3. Need for tetanus booster  - Tdap vaccine greater than or equal to 7yo IM     Follow-up: Return in about 3 months (around 07/24/2017).   Alfonse Spruce FNP

## 2017-04-23 NOTE — Patient Instructions (Signed)

## 2017-05-05 ENCOUNTER — Ambulatory Visit: Payer: Self-pay

## 2017-05-06 ENCOUNTER — Encounter: Payer: Self-pay | Admitting: Family Medicine

## 2017-05-06 ENCOUNTER — Other Ambulatory Visit: Payer: Self-pay | Admitting: *Deleted

## 2017-05-06 ENCOUNTER — Ambulatory Visit (HOSPITAL_BASED_OUTPATIENT_CLINIC_OR_DEPARTMENT_OTHER): Payer: Medicaid Other | Admitting: Family Medicine

## 2017-05-06 ENCOUNTER — Ambulatory Visit: Payer: Medicaid Other | Attending: Family Medicine

## 2017-05-06 VITALS — BP 95/65 | HR 98 | Temp 97.6°F | Resp 18 | Ht 70.0 in | Wt 166.8 lb

## 2017-05-06 DIAGNOSIS — Z79899 Other long term (current) drug therapy: Secondary | ICD-10-CM | POA: Diagnosis not present

## 2017-05-06 DIAGNOSIS — B349 Viral infection, unspecified: Secondary | ICD-10-CM

## 2017-05-06 DIAGNOSIS — J069 Acute upper respiratory infection, unspecified: Secondary | ICD-10-CM | POA: Diagnosis present

## 2017-05-06 MED ORDER — FLUTICASONE PROPIONATE 50 MCG/ACT NA SUSP: 2.0000 | Freq: Every day | NASAL | 2 refills | Status: DC

## 2017-05-06 MED ORDER — PHENYLEPHRINE-CHLORPHEN-DM 5-2-10 MG/5ML PO SYRP: 5.0000 mL | ORAL_SOLUTION | Freq: Four times a day (QID) | ORAL | 0 refills | Status: DC | PRN

## 2017-05-06 MED ORDER — HYDROCORTISONE ACETATE 25 MG RE SUPP: 25.0000 mg | Freq: Two times a day (BID) | RECTAL | 3 refills | Status: DC

## 2017-05-06 MED FILL — FLUTICASONE PROP 50 MCG SPR: 50 | 30 days supply | Qty: 16 | Fill #0

## 2017-05-06 MED FILL — CICLOPIROX 8% SOLUTION: 8 | 30 days supply | Qty: 7 | Fill #0

## 2017-05-06 NOTE — Telephone Encounter (Signed)
PRINTED FOR PASS PROGRAM 

## 2017-05-06 NOTE — Progress Notes (Signed)
Subjective:  Patient ID: Omar Payne, male    DOB: Apr 04, 1971  Age: 46 y.o. MRN: [id number]  CC: URI   HPI Omar Payne presents for cold like symptoms. Onset 1 week ago. Symptoms include rhinorrhea, nasal congestion, congested cough with scant sputum. Sputm is thin white/ scant yellow. Associated symptoms include ear fullness worse at night, history of low grade fever last week 100 F. He does report sick contacts his children, who were recently diagnosed with viral infection.   Outpatient Medications Prior to Visit  Medication Sig Dispense Refill  . Aloe-Sodium Chloride (AYR SALINE NASAL GEL NA) Place 1 application into the nose 2 (two) times daily as needed.    . ciclopirox (PENLAC) 8 % solution Apply at bedtime topically. Apply over nail and surrounding skin. Apply daily over previous coat. After seven (7) days, may remove with alcohol and continue cycle. 6.6 mL 1  . haloperidol (HALDOL) 0.5 MG tablet Take 1/2 tablet 30 tablet 0  . hydrocortisone (ANUSOL-HC) 25 MG suppository Place 1 suppository (25 mg total) rectally 2 (two) times daily. 60 suppository 1  . levETIRAcetam (KEPPRA) 750 MG tablet Take 2 tablets (1,500 mg total) by mouth 2 (two) times daily. Please give immediate release version/cheaper 120 tablet 2  . zonisamide (ZONEGRAN) 100 MG capsule Take 6 capsules (600 mg total) by mouth daily. 360 capsule 2  . fluticasone (FLONASE) 50 MCG/ACT nasal spray Place 2 sprays into the nose daily.     No facility-administered medications prior to visit.     ROS Review of Systems  Constitutional: Fever: history   HENT: Positive for congestion and rhinorrhea.   Eyes: Negative.   Respiratory: Positive for cough.   Cardiovascular: Negative.   Gastrointestinal: Negative.    Objective:  BP 95/65 (BP Location: Left Arm, Patient Position: Sitting, Cuff Size: Normal)   Pulse 98   Temp 97.6 F (36.4 C) (Oral)   Resp 18   Ht 5\' 10"  (1.778 m)   Wt 166 lb 12.8 oz (75.7 kg)   SpO2 98%    BMI 23.93 kg/m   BP/Weight 05/06/2017 04/23/2017 37/85/8850  Systolic BP 95 277 90  Diastolic BP 65 77 60  Wt. (Lbs) 166.8 166.6 165  BMI 23.93 24.6 24.37   Physical Exam  Constitutional: He appears toxic.  HENT:  Head: Normocephalic and atraumatic.  Right Ear: External ear normal. No tenderness. Tympanic membrane is not injected.  Left Ear: External ear normal. No tenderness. Tympanic membrane is not injected.  Nose: Mucosal edema and rhinorrhea present.  Mouth/Throat: Oropharynx is clear and moist.  Mild tenderness to frontal sinuses with palpation.  Eyes: Conjunctivae are normal. Pupils are equal, round, and reactive to light.  Neck: Normal range of motion. Neck supple.  Cardiovascular: Normal rate, regular rhythm, normal heart sounds and intact distal pulses.  Pulmonary/Chest: Effort normal and breath sounds normal. He has no wheezes.  Abdominal: Soft. Bowel sounds are normal. There is no tenderness.  Lymphadenopathy:    He has no cervical adenopathy.  Skin: Skin is warm and dry.  Nursing note and vitals reviewed.    Assessment & Plan:   1. Viral syndrome Increase fluid intake, good handwashing. - Phenylephrine-Chlorphen-DM 10-16-08 MG/5ML SYRP; Take 5 mLs by mouth every 6 (six) hours as needed.  Dispense: 1 Bottle; Refill: 0 - fluticasone (FLONASE) 50 MCG/ACT nasal spray; Place 2 sprays into both nostrils daily.  Dispense: 1 g; Refill: 2 - Respiratory virus panel     Follow-up: Return if  symptoms worsen or fail to improve.   Alfonse Spruce FNP

## 2017-05-06 NOTE — Patient Instructions (Signed)
Viral Illness, Adult Viruses are tiny germs that can get into a person's body and cause illness. There are many different types of viruses, and they cause many types of illness. Viral illnesses can range from mild to severe. They can affect various parts of the body. Common illnesses that are caused by a virus include colds and the flu. Viral illnesses also include serious conditions such as HIV/AIDS (human immunodeficiency virus/acquired immunodeficiency syndrome). A few viruses have been linked to certain cancers. What are the causes? Many types of viruses can cause illness. Viruses invade cells in your body, multiply, and cause the infected cells to malfunction or die. When the cell dies, it releases more of the virus. When this happens, you develop symptoms of the illness, and the virus continues to spread to other cells. If the virus takes over the function of the cell, it can cause the cell to divide and grow out of control, as is the case when a virus causes cancer. Different viruses get into the body in different ways. You can get a virus by:  Swallowing food or water that is contaminated with the virus.  Breathing in droplets that have been coughed or sneezed into the air by an infected person.  Touching a surface that has been contaminated with the virus and then touching your eyes, nose, or mouth.  Being bitten by an insect or animal that carries the virus.  Having sexual contact with a person who is infected with the virus.  Being exposed to blood or fluids that contain the virus, either through an open cut or during a transfusion.  If a virus enters your body, your body's defense system (immune system) will try to fight the virus. You may be at higher risk for a viral illness if your immune system is weak. What are the signs or symptoms? Symptoms vary depending on the type of virus and the location of the cells that it invades. Common symptoms of the main types of viral illnesses  include: Cold and flu viruses  Fever.  Headache.  Sore throat.  Muscle aches.  Nasal congestion.  Cough. Digestive system (gastrointestinal) viruses  Fever.  Abdominal pain.  Nausea.  Diarrhea. Liver viruses (hepatitis)  Loss of appetite.  Tiredness.  Yellowing of the skin (jaundice). Brain and spinal cord viruses  Fever.  Headache.  Stiff neck.  Nausea and vomiting.  Confusion or sleepiness. Skin viruses  Warts.  Itching.  Rash. Sexually transmitted viruses  Discharge.  Swelling.  Redness.  Rash. How is this treated? Viruses can be difficult to treat because they live within cells. Antibiotic medicines do not treat viruses because these drugs do not get inside cells. Treatment for a viral illness may include:  Resting and drinking plenty of fluids.  Medicines to relieve symptoms. These can include over-the-counter medicine for pain and fever, medicines for cough or congestion, and medicines to relieve diarrhea.  Antiviral medicines. These drugs are available only for certain types of viruses. They may help reduce flu symptoms if taken early. There are also many antiviral medicines for hepatitis and HIV/AIDS.  Some viral illnesses can be prevented with vaccinations. A common example is the flu shot. Follow these instructions at home: Medicines   Take over-the-counter and prescription medicines only as told by your health care provider.  If you were prescribed an antiviral medicine, take it as told by your health care provider. Do not stop taking the medicine even if you start to feel better.  Be aware   of when antibiotics are needed and when they are not needed. Antibiotics do not treat viruses. If your health care provider thinks that you may have a bacterial infection as well as a viral infection, you may get an antibiotic. ? Do not ask for an antibiotic prescription if you have been diagnosed with a viral illness. That will not make your  illness go away faster. ? Frequently taking antibiotics when they are not needed can lead to antibiotic resistance. When this develops, the medicine no longer works against the bacteria that it normally fights. General instructions  Drink enough fluids to keep your urine clear or pale yellow.  Rest as much as possible.  Return to your normal activities as told by your health care provider. Ask your health care provider what activities are safe for you.  Keep all follow-up visits as told by your health care provider. This is important. How is this prevented? Take these actions to reduce your risk of viral infection:  Eat a healthy diet and get enough rest.  Wash your hands often with soap and water. This is especially important when you are in public places. If soap and water are not available, use hand sanitizer.  Avoid close contact with friends and family who have a viral illness.  If you travel to areas where viral gastrointestinal infection is common, avoid drinking water or eating raw food.  Keep your immunizations up to date. Get a flu shot every year as told by your health care provider.  Do not share toothbrushes, nail clippers, razors, or needles with other people.  Always practice safe sex.  Contact a health care provider if:  You have symptoms of a viral illness that do not go away.  Your symptoms come back after going away.  Your symptoms get worse. Get help right away if:  You have trouble breathing.  You have a severe headache or a stiff neck.  You have severe vomiting or abdominal pain. This information is not intended to replace advice given to you by your health care provider. Make sure you discuss any questions you have with your health care provider. Document Released: 10/13/2015 Document Revised: 11/15/2015 Document Reviewed: 10/13/2015 Elsevier Interactive Patient Education  2018 [employer]vier Inc.  

## 2017-05-08 LAB — RESPIRATORY VIRUS PANEL
ADENOVIRUS: NEGATIVE
INFLUENZA A: NEGATIVE
INFLUENZA B 1: NEGATIVE
METAPNEUMOVIRUS: NEGATIVE
PARAINFLUENZA 1 A: NEGATIVE
Parainfluenza 2: NEGATIVE
Parainfluenza 3: NEGATIVE
RHINOVIRUS: POSITIVE — AB
Respiratory Syncytial Virus A: NEGATIVE
Respiratory Syncytial Virus B: NEGATIVE

## 2017-05-13 ENCOUNTER — Telehealth: Payer: Self-pay

## 2017-05-13 NOTE — Telephone Encounter (Signed)
-----   Message from Alfonse Spruce, [place] sent at 05/12/2017 12:19 PM EST ----- Positive for rhinovirus. Rhinovirus is most associated with the common cold. Increase fluid intake and frequent handwashing to decrease spread.  Antibiotics are ineffective against viral infections. For most viral syndrome symptoms can last between 1 to 3 weeks.  Follow up if symptoms worsen or fail to improve.

## 2017-05-13 NOTE — Telephone Encounter (Signed)
CMA call regarding lab results   Patient Verify DOB   Patient was aware and understood  

## 2017-05-15 MED FILL — levETIRAcetam 750 MG TABS: 750 | 30 days supply | Qty: 120 | Fill #0

## 2017-05-15 MED FILL — ZONISAMIDE 100 MG CAPSULE: 100 | 30 days supply | Qty: 180 | Fill #0

## 2017-06-03 MED FILL — $CANASA 1000 MG SUPPOSITORY: 1000 | 30 days supply | Qty: 30 | Fill #0

## 2017-06-12 MED FILL — levETIRAcetam 750 MG TABS: 750 | 30 days supply | Qty: 120 | Fill #1

## 2017-06-12 MED FILL — ZONISAMIDE 100 MG CAPSULE: 100 | 30 days supply | Qty: 180 | Fill #1

## 2017-06-27 ENCOUNTER — Other Ambulatory Visit: Payer: Self-pay | Admitting: *Deleted

## 2017-06-27 ENCOUNTER — Other Ambulatory Visit: Payer: Self-pay | Admitting: Family Medicine

## 2017-06-27 MED ORDER — MESALAMINE 1000 MG RE SUPP: 1000.0000 mg | Freq: Every day | RECTAL | 12 refills | Status: DC

## 2017-06-27 MED FILL — $CANASA 1000 MG SUPPOSITORY: 1000 | 30 days supply | Qty: 30 | Fill #0

## 2017-06-27 NOTE — Telephone Encounter (Signed)
REFILLED

## 2017-07-15 ENCOUNTER — Telehealth: Payer: Self-pay | Admitting: Family Medicine

## 2017-07-15 NOTE — Telephone Encounter (Signed)
He would need to be seen in office before referral can be placed.

## 2017-07-15 NOTE — Telephone Encounter (Signed)
Patient called asking for a referral for hemorrhoids. Please fu with the patient.

## 2017-07-17 NOTE — Telephone Encounter (Signed)
Apt schedule for patient to come in and address concern.

## 2017-07-18 ENCOUNTER — Encounter: Payer: Self-pay | Admitting: Family Medicine

## 2017-07-18 ENCOUNTER — Ambulatory Visit: Payer: Medicaid Other | Attending: Family Medicine | Admitting: Family Medicine

## 2017-07-18 VITALS — BP 107/75 | HR 89 | Temp 97.6°F | Resp 16 | Wt 158.4 lb

## 2017-07-18 DIAGNOSIS — K921 Melena: Secondary | ICD-10-CM | POA: Diagnosis not present

## 2017-07-18 DIAGNOSIS — K649 Unspecified hemorrhoids: Secondary | ICD-10-CM

## 2017-07-18 DIAGNOSIS — M25561 Pain in right knee: Secondary | ICD-10-CM | POA: Diagnosis not present

## 2017-07-18 DIAGNOSIS — R32 Unspecified urinary incontinence: Secondary | ICD-10-CM | POA: Diagnosis not present

## 2017-07-18 DIAGNOSIS — R159 Full incontinence of feces: Secondary | ICD-10-CM | POA: Insufficient documentation

## 2017-07-18 DIAGNOSIS — G8929 Other chronic pain: Secondary | ICD-10-CM | POA: Insufficient documentation

## 2017-07-18 DIAGNOSIS — Z79899 Other long term (current) drug therapy: Secondary | ICD-10-CM | POA: Insufficient documentation

## 2017-07-18 DIAGNOSIS — M238X1 Other internal derangements of right knee: Secondary | ICD-10-CM

## 2017-07-18 DIAGNOSIS — M25361 Other instability, right knee: Secondary | ICD-10-CM

## 2017-07-18 MED ORDER — ACETAMINOPHEN 500 MG PO TABS: 1000.0000 mg | ORAL_TABLET | Freq: Four times a day (QID) | ORAL | 0 refills | Status: DC | PRN

## 2017-07-18 MED ORDER — KNEE BRACE MISC: 0 refills | Status: DC

## 2017-07-18 MED FILL — $CANASA 1000 MG SUPPOSITORY: 1000 | 30 days supply | Qty: 30 | Fill #1 | Status: TO

## 2017-07-18 NOTE — Patient Instructions (Signed)

## 2017-07-18 NOTE — Progress Notes (Signed)
Pt is requesting a referral to [facility] to the GI department  Pt states he doesn't have any other complaints

## 2017-07-18 NOTE — Progress Notes (Signed)
Subjective:  Patient ID: Omar Payne, male    DOB: 15-Nov-1970  Age: 47 y.o. MRN: [id number]  CC: Referral   HPI Omar Payne presents for referral. He reports symptoms of hemorrhoids. Onset 2 years ago. He reports having difficulty with getting bowel movement to pass and flatulence. He states " It feels like a blockage in place". He reports BM QD upward to 6 x's per day. He reports fecal urgency with some episode of incontinence. Associated symptoms include abdominal cramping. He reports history of mucous and blood in stools for last 2 years. Reports going to GI specialist Dr. Ferdinand Lango at [facility] in [facility]. He reports contacting office recently and they recommended he get an referral.   Knee pain Onset 1 year. Symptoms constant popping with flexion. Associated symptoms instability. Pain with extension. He denies any history of injury, swelling, or erythema. He denies taking any for symptoms    Outpatient Medications Prior to Visit  Medication Sig Dispense Refill  . fluticasone (FLONASE) 50 MCG/ACT nasal spray Place 2 sprays into both nostrils daily. 1 g 2  . levETIRAcetam (KEPPRA) 750 MG tablet Take 2 tablets (1,500 mg total) by mouth 2 (two) times daily. Please give immediate release version/cheaper 120 tablet 2  . mesalamine (CANASA) 1000 MG suppository Place 1 suppository (1,000 mg total) rectally at bedtime. 30 suppository 12  . zonisamide (ZONEGRAN) 100 MG capsule Take 6 capsules (600 mg total) by mouth daily. 360 capsule 2  . Aloe-Sodium Chloride (AYR SALINE NASAL GEL NA) Place 1 application into the nose 2 (two) times daily as needed.    . ciclopirox (PENLAC) 8 % solution Apply at bedtime topically. Apply over nail and surrounding skin. Apply daily over previous coat. After seven (7) days, may remove with alcohol and continue cycle. (Patient not taking: Reported on 07/18/2017) 6.6 mL 1  . haloperidol (HALDOL) 0.5 MG tablet Take 1/2 tablet (Patient not taking: Reported on  07/18/2017) 30 tablet 0  . hydrocortisone (ANUSOL-HC) 25 MG suppository Place 1 suppository (25 mg total) rectally 2 (two) times daily. (Patient not taking: Reported on 07/18/2017) 90 suppository 3  . Phenylephrine-Chlorphen-DM 10-16-08 MG/5ML SYRP Take 5 mLs by mouth every 6 (six) hours as needed. (Patient not taking: Reported on 07/18/2017) 1 Bottle 0   No facility-administered medications prior to visit.     ROS Review of Systems  Constitutional: Negative.   HENT: Negative.   Respiratory: Negative.   Cardiovascular: Negative.   Gastrointestinal:       Hemorrhoids  Musculoskeletal: Positive for arthralgias.  Skin: Negative.         Objective:  BP 107/75   Pulse 89   Temp 97.6 F (36.4 C) (Oral)   Resp 16   Wt 158 lb 6.4 oz (71.8 kg)   SpO2 97%   BMI 22.73 kg/m   BP/Weight 07/18/2017 05/06/2017 62/01/3150  Systolic BP 761 95 607  Diastolic BP 75 65 77  Wt. (Lbs) 158.4 166.8 166.6  BMI 22.73 23.93 24.6     Physical Exam  Constitutional: He appears well-developed and well-nourished.  HENT:  Head: Normocephalic and atraumatic.  Right Ear: External ear normal.  Left Ear: External ear normal.  Nose: Nose normal.  Mouth/Throat: Oropharynx is clear and moist.  Cardiovascular: Normal rate, regular rhythm, normal heart sounds and intact distal pulses.  Pulmonary/Chest: Effort normal and breath sounds normal.  Abdominal: Soft. Bowel sounds are normal. There is no tenderness.  Genitourinary:  Genitourinary Comments: Pt.refused DRE w/ hemoccult.  Musculoskeletal:       Right knee: He exhibits abnormal patellar mobility. He exhibits no swelling and no erythema. Tenderness found. Medial joint line (crepitus also present) tenderness noted.  Skin: Skin is warm and dry.  Nursing note and vitals reviewed.    Assessment & Plan:   1. Hemorrhoids, unspecified hemorrhoid type He declines digit rectal examination and hemoccult.  - Ambulatory referral to Gastroenterology  2.  Chronic pain of right knee  - DG Knee Complete 4 Views Right; Future - Elastic Bandages & Supports (KNEE BRACE) MISC; APPLY ONE KNEE BRACE TO RIGHT KNEE FOR STABILITY AND SUPPORT. TO BE FITTED BY MEDICAL SUPPLY.  Dispense: 1 each; Refill: 0 - acetaminophen (TYLENOL) 500 MG tablet; Take 2 tablets (1,000 mg total) by mouth every 6 (six) hours as needed.  Dispense: 30 tablet; Refill: 0  3. Knee crepitus, right  - DG Knee Complete 4 Views Right; Future  4. Instability of right knee joint  - DG Knee Complete 4 Views Right; Future - Elastic Bandages & Supports (KNEE BRACE) MISC; APPLY ONE KNEE BRACE TO RIGHT KNEE FOR STABILITY AND SUPPORT. TO BE FITTED BY MEDICAL SUPPLY.  Dispense: 1 each; Refill: 0  5. Incontinence of feces, unspecified fecal incontinence type  - Ambulatory referral to Gastroenterology      Follow-up: Return if symptoms worsen or fail to improve.   Alfonse Spruce FNP

## 2017-07-24 ENCOUNTER — Ambulatory Visit: Payer: Self-pay | Admitting: Family Medicine

## 2017-07-25 MED FILL — levETIRAcetam 750 MG TABS: 750 | 30 days supply | Qty: 120 | Fill #2

## 2017-08-04 ENCOUNTER — Telehealth: Payer: Self-pay | Admitting: Family Medicine

## 2017-08-04 ENCOUNTER — Other Ambulatory Visit: Payer: Self-pay | Admitting: Family Medicine

## 2017-08-04 DIAGNOSIS — G40909 Epilepsy, unspecified, not intractable, without status epilepticus: Secondary | ICD-10-CM

## 2017-08-04 NOTE — Telephone Encounter (Signed)
Noted  

## 2017-08-04 NOTE — Telephone Encounter (Signed)
Please schedule appointment:   Omar Payne [place] Phone Number:[phone] Has appointment on September 10, 2017 @ 2p  Needs referral to be place before the appointment.  Please contact for questions via phone.

## 2017-08-04 NOTE — Telephone Encounter (Signed)
Referral placed.

## 2017-08-04 NOTE — Telephone Encounter (Signed)
Pt. Called stating that he has medicaid and needs a referral to the neurologist. Pt. States that he already sees a neurologist but in order for medicaid to pay they need a referral. Please f/u

## 2017-08-06 MED FILL — ZONISAMIDE 100 MG CAPSULE: 100 | 30 days supply | Qty: 180 | Fill #2 | Status: TO

## 2017-09-25 ENCOUNTER — Ambulatory Visit (HOSPITAL_COMMUNITY)
Admission: RE | Admit: 2017-09-25 | Discharge: 2017-09-25 | Disposition: A | Discharge status: Home / Self Care | Payer: Medicaid Other | Source: Ambulatory Visit | Attending: Family Medicine | Admitting: Family Medicine

## 2017-09-25 DIAGNOSIS — M238X1 Other internal derangements of right knee: Secondary | ICD-10-CM | POA: Diagnosis not present

## 2017-09-25 DIAGNOSIS — M25361 Other instability, right knee: Secondary | ICD-10-CM | POA: Diagnosis present

## 2017-09-25 DIAGNOSIS — M25561 Pain in right knee: Secondary | ICD-10-CM | POA: Insufficient documentation

## 2017-09-25 DIAGNOSIS — G8929 Other chronic pain: Secondary | ICD-10-CM | POA: Diagnosis present

## 2017-11-24 ENCOUNTER — Telehealth: Payer: Self-pay | Admitting: Family Medicine

## 2017-11-24 NOTE — Telephone Encounter (Signed)
Error

## 2017-11-27 ENCOUNTER — Ambulatory Visit: Payer: Medicaid Other | Attending: Family Medicine | Admitting: Physician Assistant

## 2017-11-27 VITALS — BP 120/79 | HR 87 | Temp 98.0°F | Resp 18 | Ht 69.0 in | Wt 155.2 lb

## 2017-11-27 DIAGNOSIS — B070 Plantar wart: Secondary | ICD-10-CM | POA: Diagnosis not present

## 2017-11-27 DIAGNOSIS — M25561 Pain in right knee: Secondary | ICD-10-CM

## 2017-11-27 DIAGNOSIS — Z79899 Other long term (current) drug therapy: Secondary | ICD-10-CM | POA: Insufficient documentation

## 2017-11-27 DIAGNOSIS — Z8669 Personal history of other diseases of the nervous system and sense organs: Secondary | ICD-10-CM | POA: Insufficient documentation

## 2017-11-27 NOTE — Progress Notes (Signed)
Patient ID: Omar E Payne, male   DOB: 1970/07/14, 47 y.o.   MRN: [id number]   Omar Payne, is a 47 y.o. male  TFT:[id number]  HCW:[id number]  DOB - 07/13/1970  Subjective:  Chief Complaint and HPI: Omar Payne is a 47 y.o. male here today for L foot warts on the bottom of his foot for a few weeks.  He has had them in the past and OTC did not work.    Also, continues to have R knee pain and c/o laxity in his R knee cap.  Imaging 09/2017 was normal.    ROS:   Constitutional:  No f/c, No night sweats, No unexplained weight loss. EENT:  No vision changes, No blurry vision, No hearing changes. No mouth, throat, or ear problems.  Respiratory: No cough, No SOB Cardiac: No CP, no palpitations GI:  No abd pain, No N/V/D. GU: No Urinary s/sx Musculoskeletal: + R knee pain Neuro: No headache, no dizziness, no motor weakness.  Skin: No rash Endocrine:  No polydipsia. No polyuria.  Psych: Denies SI/HI  No problems updated.  ALLERGIES: No Known Allergies  PAST MEDICAL HISTORY: Past Medical History:  Diagnosis Date  . Localization-related (focal) (partial) symptomatic epilepsy and epileptic syndromes with complex partial seizures, intractable, without status epilepticus ([place])     MEDICATIONS AT HOME: Prior to Admission medications   Medication Sig Start Date End Date Taking? Authorizing Provider  acetaminophen (TYLENOL) 500 MG tablet Take 2 tablets (1,000 mg total) by mouth every 6 (six) hours as needed. 07/18/17  Yes Hairston, Toy Baker R, FNP  Aloe-Sodium Chloride (AYR SALINE NASAL GEL NA) Place 1 application into the nose 2 (two) times daily as needed.   Yes [provider]  fluticasone (FLONASE) 50 MCG/ACT nasal spray Place 2 sprays into both nostrils daily. 05/06/17  Yes Hairston, Maylon Peppers, FNP  levETIRAcetam (KEPPRA) 750 MG tablet Take 2 tablets (1,500 mg total) by mouth 2 (two) times daily. Please give immediate release version/cheaper 04/04/17  Yes Brayton Caves, PA-C    ciclopirox (PENLAC) 8 % solution Apply at bedtime topically. Apply over nail and surrounding skin. Apply daily over previous coat. After seven (7) days, may remove with alcohol and continue cycle. Patient not taking: Reported on 07/18/2017 04/23/17   Alfonse Spruce, FNP  Elastic Bandages & Supports (KNEE BRACE) MISC APPLY ONE KNEE BRACE TO RIGHT KNEE FOR STABILITY AND SUPPORT. TO BE FITTED BY MEDICAL SUPPLY. Patient not taking: Reported on 11/27/2017 07/18/17   Alfonse Spruce, FNP  haloperidol (HALDOL) 0.5 MG tablet Take 1/2 tablet Patient not taking: Reported on 07/18/2017 04/04/17   Brayton Caves, PA-C  hydrocortisone (ANUSOL-HC) 25 MG suppository Place 1 suppository (25 mg total) rectally 2 (two) times daily. Patient not taking: Reported on 07/18/2017 05/06/17   Tresa Garter, MD  mesalamine (CANASA) 1000 MG suppository Place 1 suppository (1,000 mg total) rectally at bedtime. Patient not taking: Reported on 11/27/2017 06/27/17   Tresa Garter, MD  Phenylephrine-Chlorphen-DM 10-16-08 MG/5ML SYRP Take 5 mLs by mouth every 6 (six) hours as needed. Patient not taking: Reported on 07/18/2017 05/06/17   Alfonse Spruce, FNP  zonisamide (ZONEGRAN) 100 MG capsule Take 6 capsules (600 mg total) by mouth daily. Patient not taking: Reported on 11/27/2017 04/07/17   Tresa Garter, MD     Objective:  EXAM:   Vitals:   11/27/17 0955  BP: 120/79  Pulse: 87  Resp: 18  Temp: 98 F (36.7 C)  TempSrc: Oral  SpO2: 98%  Weight: 155 lb 3.2 oz (70.4 kg)  Height: 5\' 9"  (1.753 m)    General appearance : A&OX3. NAD. Non-toxic-appearing HEENT: Atraumatic and Normocephalic.  PERRLA. EOM intact.   Neck: supple, no JVD. No cervical lymphadenopathy. No thyromegaly Chest/Lungs:  Breathing-non-labored, Good air entry bilaterally, breath sounds normal without rales, rhonchi, or wheezing  CVS: S1 S2 regular, no murmurs, gallops, rubs  Extremities: Bilateral Lower Ext shows no edema,  both legs are warm to touch with = pulse throughout.  R knee exam overall benign other than some patellar tendon TTP and some laxity of the patella.  Ligaments stable.  Plantar aspect of L foot-there is a plantar's wart and the base/proximal great toe and the lateral mid-foot.   Neurology:  CN II-XII grossly intact, Non focal.   Psych:  TP linear. J/I WNL. Normal speech. Appropriate eye contact and affect.  Skin:  No Rash  Data Review No results found for: HGBA1C   Assessment & Plan   1. Recurrent pain of right knee advil or tylenol prn pain - Ambulatory referral to Orthopedic Surgery  2. Plantar wart of left foot - Ambulatory referral to Podiatry     Patient have been counseled extensively about nutrition and exercise  Return if symptoms worsen or fail to improve, for needs to be assigned new PCP.  The patient was given clear instructions to go to ER or return to medical center if symptoms don't improve, worsen or new problems develop. The patient verbalized understanding. The patient was told to call to get lab results if they haven't heard anything in the next week.     Freeman Caldron, PA-C [facility] [place], [place]   11/27/2017, 10:09 AM

## 2017-12-01 ENCOUNTER — Encounter (INDEPENDENT_AMBULATORY_CARE_PROVIDER_SITE_OTHER): Payer: Self-pay | Admitting: Orthopaedic Surgery

## 2017-12-01 ENCOUNTER — Ambulatory Visit (INDEPENDENT_AMBULATORY_CARE_PROVIDER_SITE_OTHER): Payer: Medicaid Other | Admitting: Orthopaedic Surgery

## 2017-12-01 DIAGNOSIS — M25561 Pain in right knee: Secondary | ICD-10-CM | POA: Diagnosis not present

## 2017-12-01 DIAGNOSIS — G8929 Other chronic pain: Secondary | ICD-10-CM | POA: Diagnosis not present

## 2017-12-01 MED ORDER — METHYLPREDNISOLONE ACETATE 40 MG/ML IJ SUSP
40.0000 mg | INTRAMUSCULAR | Status: AC | PRN
  Administered 2017-12-01: 40 mg via INTRA_ARTICULAR

## 2017-12-01 MED ORDER — LIDOCAINE HCL 1 % IJ SOLN
3.0000 mL | INTRAMUSCULAR | Status: AC | PRN
  Administered 2017-12-01: 3 mL

## 2017-12-01 NOTE — Progress Notes (Signed)
Office Visit Note   Patient: Omar Payne           Date of Birth: Jul 02, 1970           MRN: [id number] Visit Date: 12/01/2017              Requested by: Argentina Donovan, PA-C [place], [place] [address] PCP: Alfonse Spruce, FNP   Assessment & Plan: Visit Diagnoses:  1. Chronic pain of right knee     Plan: I talked with him about trying a one-time steroid injection in his right knee today to see if this would help him from a symptom standpoint.  I explained in detail the risks and benefits of these types of injections.  He was interested in having the injection and tolerated it well.  We will see him back in 2 weeks to see how is doing from a symptom standpoint.  If he continues to have the popping with locking catching with an MRI would be warranted.  He is a very thin individual.  He will still work on strengthening his quads anti-inflammatories as well.  All question concerns were answered and addressed.  Follow-Up Instructions: Return in about 2 weeks (around 12/15/2017).   Orders:  Orders Placed This Encounter  Procedures  . Large Joint Inj   No orders of the defined types were placed in this encounter.     Procedures: Large Joint Inj: R knee on 12/01/2017 10:32 AM Indications: diagnostic evaluation and pain Details: 22 G 1.5 in needle, superolateral approach  Arthrogram: No  Medications: 3 mL lidocaine 1 %; 40 mg methylPREDNISolone acetate 40 MG/ML Outcome: tolerated well, no immediate complications Procedure, treatment alternatives, risks and benefits explained, specific risks discussed. Consent was given by the patient. Immediately prior to procedure a time out was called to verify the correct patient, procedure, equipment, support staff and site/side marked as required. Patient was prepped and draped in the usual sterile fashion.       Clinical Data: No additional findings.   Subjective: Chief Complaint  Patient presents with  . Right  Knee - Pain  Patient is a very pleasant 47 year old gentleman who is been dealing with 4 to 5-year history of right knee pain with locking and catching in his knee.  Is mainly the lateral aspect of his knee that hurts.  He denies any specific injury but is worked as Clinical biochemist in the past.  He is also someone who is dealt with epilepsy and has had brain surgery to deal with his epilepsy.  He is on disability because of this but wants to get back to work.  He says his main limiting factor is his knee right now.  He has never had any type of injection in the knee either.  HPI  Review of Systems He currently denies any headache, chest pain, shortness of breath, fever, chills, nausea, vomiting.  Objective: Vital Signs: There were no vitals taken for this visit.  Physical Exam He is alert and oriented x3 and in no acute distress Ortho Exam Examination of his right knee shows no effusion at all.  He has lateral joint line tenderness and tenderness to the lateral aspect of his patella.  There is slight patellofemoral crepitation as well.  There is no medial lateral joint line tenderness and his Lockman's McMurray's exam are negative.  He does get significant amount of pain when I flex him past 90 degrees and this is all in the right knee.  Specialty Comments:  No specialty comments available.  Imaging: No results found. X-rays independently reviewed of the right knee show no acute findings.  The joint space is well maintained and the alignment is neutral.  PMFS History: Patient Active Problem List   Diagnosis Date Noted  . Seizure ([place]) 03/16/2017  . Seizures ([place]) 03/14/2017  . Hypokalemia 03/14/2017  . AKI (acute kidney injury) ([place]) 03/14/2017  . Post-ictal confusion 03/14/2017  . Psychosis ([employer]) 03/14/2017   Past Medical History:  Diagnosis Date  . Localization-related (focal) (partial) symptomatic epilepsy and epileptic syndromes with complex partial seizures, intractable, without  status epilepticus ([place])     Family History  Problem Relation Age of Onset  . Hypertension Father   . Bipolar disorder Brother     Past Surgical History:  Procedure Laterality Date  . BRAIN SURGERY     right temporal lobectomy  . VNS     VNS implant   Social History   Occupational History  . Not on file  Tobacco Use  . Smoking status: Never Smoker  . Smokeless tobacco: Never Used  Substance and Sexual Activity  . Alcohol use: No  . Drug use: No  . Sexual activity: Not on file

## 2017-12-15 ENCOUNTER — Encounter (INDEPENDENT_AMBULATORY_CARE_PROVIDER_SITE_OTHER): Payer: Self-pay | Admitting: Orthopaedic Surgery

## 2017-12-15 ENCOUNTER — Ambulatory Visit (INDEPENDENT_AMBULATORY_CARE_PROVIDER_SITE_OTHER): Payer: Medicaid Other | Admitting: Orthopaedic Surgery

## 2017-12-15 DIAGNOSIS — G8929 Other chronic pain: Secondary | ICD-10-CM | POA: Diagnosis not present

## 2017-12-15 DIAGNOSIS — M25561 Pain in right knee: Secondary | ICD-10-CM | POA: Diagnosis not present

## 2017-12-15 NOTE — Progress Notes (Signed)
Patient is here today in follow-up for his right knee.  He is a chronic knee pain with locking catching.  He has never had a steroid injection 2 Weeks Ago We Did Place a steroid injection in his right knee.  He did get some relief in terms of his pain he still gets a catching sensation this been going on for 5 years now and now worsening.  He has worked on activity modification and quad strengthening exercises.  Again the injection to calm down some of his symptoms in terms of pain but did not calm down the mechanical symptoms.  On exam he still has some lateral and medial joint tenderness.  Hard to tell if this is truly a meniscal tear versus a very symptomatic plica.  At this point I do feel that he would benefit from an MRI of his knee at this point given the very conservative treatments would get an idea of what is causing his mechanical symptoms.  This is appropriate this point given the fact that he has tried and failed all forms conservative treatment.  We will see him back in follow-up to go over the MRI of his right knee.  All questions concerns were answered and addressed.

## 2017-12-17 ENCOUNTER — Other Ambulatory Visit (INDEPENDENT_AMBULATORY_CARE_PROVIDER_SITE_OTHER): Payer: Self-pay

## 2017-12-17 ENCOUNTER — Ambulatory Visit: Payer: Medicaid Other | Admitting: Podiatry

## 2017-12-17 DIAGNOSIS — G8929 Other chronic pain: Secondary | ICD-10-CM

## 2017-12-17 DIAGNOSIS — M25561 Pain in right knee: Principal | ICD-10-CM

## 2017-12-26 ENCOUNTER — Ambulatory Visit: Payer: Medicaid Other | Admitting: Podiatry

## 2017-12-26 ENCOUNTER — Encounter: Payer: Self-pay | Admitting: Podiatry

## 2017-12-26 DIAGNOSIS — Q828 Other specified congenital malformations of skin: Secondary | ICD-10-CM

## 2017-12-26 DIAGNOSIS — D492 Neoplasm of unspecified behavior of bone, soft tissue, and skin: Secondary | ICD-10-CM

## 2018-01-06 ENCOUNTER — Ambulatory Visit: Payer: Self-pay | Admitting: Nurse Practitioner

## 2018-01-06 NOTE — Progress Notes (Signed)
Subjective:  Patient ID: Omar Payne, male    DOB: [birth year],  MRN: [id number]  Chief Complaint  Patient presents with  . Plantar Warts    left foot -1st metatrsal and 5th metatarsal   47 y.o. male presents with the above complaint.  Reports what he believes to be plantars warts to the bottom of the left foot underneath the first metatarsal and fifth metatarsal. Past Medical History:  Diagnosis Date  . Localization-related (focal) (partial) symptomatic epilepsy and epileptic syndromes with complex partial seizures, intractable, without status epilepticus [facility])    Past Surgical History:  Procedure Laterality Date  . BRAIN SURGERY     right temporal lobectomy  . VNS     VNS implant    Current Outpatient Medications:  .  acetaminophen (TYLENOL) 500 MG tablet, Take 2 tablets (1,000 mg total) by mouth every 6 (six) hours as needed., Disp: 30 tablet, Rfl: 0 .  Aloe-Sodium Chloride (AYR SALINE NASAL GEL NA), Place 1 application into the nose 2 (two) times daily as needed., Disp: , Rfl:  .  ciclopirox (PENLAC) 8 % solution, Apply at bedtime topically. Apply over nail and surrounding skin. Apply daily over previous coat. After seven (7) days, may remove with alcohol and continue cycle. (Patient not taking: Reported on 07/18/2017), Disp: 6.6 mL, Rfl: 1 .  Elastic Bandages & Supports (KNEE BRACE) MISC, APPLY ONE KNEE BRACE TO RIGHT KNEE FOR STABILITY AND SUPPORT. TO BE FITTED BY MEDICAL SUPPLY. (Patient not taking: Reported on 11/27/2017), Disp: 1 each, Rfl: 0 .  fluticasone (FLONASE) 50 MCG/ACT nasal spray, Place 2 sprays into both nostrils daily., Disp: 1 g, Rfl: 2 .  haloperidol (HALDOL) 0.5 MG tablet, Take 1/2 tablet (Patient not taking: Reported on 07/18/2017), Disp: 30 tablet, Rfl: 0 .  hydrocortisone (ANUSOL-HC) 25 MG suppository, Place 1 suppository (25 mg total) rectally 2 (two) times daily. (Patient not taking: Reported on 07/18/2017), Disp: 90 suppository, Rfl: 3 .  levETIRAcetam (KEPPRA)  750 MG tablet, Take 2 tablets (1,500 mg total) by mouth 2 (two) times daily. Please give immediate release version/cheaper, Disp: 120 tablet, Rfl: 2 .  LIALDA 1.2 g EC tablet, Take 2.4 g by mouth daily., Disp: , Rfl: 2 .  mesalamine (CANASA) 1000 MG suppository, Place 1 suppository (1,000 mg total) rectally at bedtime. (Patient not taking: Reported on 11/27/2017), Disp: 30 suppository, Rfl: 12 .  Phenylephrine-Chlorphen-DM 10-16-08 MG/5ML SYRP, Take 5 mLs by mouth every 6 (six) hours as needed. (Patient not taking: Reported on 07/18/2017), Disp: 1 Bottle, Rfl: 0 .  zonisamide (ZONEGRAN) 100 MG capsule, Take 6 capsules (600 mg total) by mouth daily. (Patient not taking: Reported on 11/27/2017), Disp: 360 capsule, Rfl: 2  No Known Allergies Review of Systems: Negative except as noted in the HPI. Denies N/V/F/Ch. Objective:  There were no vitals filed for this visit. General AA&O x3. Normal mood and affect.  Vascular Dorsalis pedis and posterior tibial pulses  present 2+ bilaterally  Capillary refill normal to all digits. Pedal hair growth normal.  Neurologic Epicritic sensation grossly present.  Dermatologic No open lesions. Interspaces clear of maceration. Nails well groomed and normal in appearance.  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 Normal joint ROM without pain or crepitus.   Assessment & Plan:  Patient was evaluated and treated and all questions answered.  Plantar hyperkeratoses -Discussed etiology.   -Debridement of lesions x2 with 15 blade today -Follow-up should symptoms worsen  Return if symptoms worsen or fail to improve. 

## 2018-01-13 ENCOUNTER — Encounter (INDEPENDENT_AMBULATORY_CARE_PROVIDER_SITE_OTHER): Payer: Self-pay | Admitting: *Deleted

## 2018-01-14 ENCOUNTER — Ambulatory Visit (INDEPENDENT_AMBULATORY_CARE_PROVIDER_SITE_OTHER): Payer: Medicaid Other | Admitting: Orthopaedic Surgery

## 2018-01-16 ENCOUNTER — Inpatient Hospital Stay: Admission: RE | Admit: 2018-01-16 | Payer: Self-pay | Source: Ambulatory Visit

## 2018-01-16 ENCOUNTER — Encounter (INDEPENDENT_AMBULATORY_CARE_PROVIDER_SITE_OTHER): Payer: Self-pay | Admitting: Orthopaedic Surgery

## 2018-01-16 ENCOUNTER — Other Ambulatory Visit (INDEPENDENT_AMBULATORY_CARE_PROVIDER_SITE_OTHER): Payer: Self-pay | Admitting: Orthopaedic Surgery

## 2018-01-16 ENCOUNTER — Telehealth (INDEPENDENT_AMBULATORY_CARE_PROVIDER_SITE_OTHER): Payer: Self-pay

## 2018-01-16 DIAGNOSIS — M25561 Pain in right knee: Principal | ICD-10-CM

## 2018-01-16 DIAGNOSIS — G8929 Other chronic pain: Secondary | ICD-10-CM

## 2018-01-16 NOTE — Telephone Encounter (Signed)
MRI was scheduled for this morning but the lady that did the scheduling just called back & cancelled. I told her when the appointment was being made I had a pacemaker & it seemed 2 be fine. This morning, pacemakers can't be in the machine. So we have to do something else. To add to the mess now my right shoulder is doing the same thing. I'll see you soon    I got this message from the patient, do you want to do a CT scan instead maybe?

## 2018-01-19 NOTE — Telephone Encounter (Signed)
Please get CT of his knee instead

## 2018-01-19 NOTE — Telephone Encounter (Signed)
Can we get a CT instead of an MRI since he has a pacemake?

## 2018-01-21 NOTE — Telephone Encounter (Signed)
I sw Ginger with [place] scheduling, she is going to talk with the tech and see if can get pt scheduled for MRI. They have to review the information and if it is compatible they will schedule for MRI. If not will call to advise me and then we can get CT.

## 2018-01-22 NOTE — Telephone Encounter (Signed)
I C central scheduling and sw Manuela Schwartz and she is going to wait until Ginger gets in to talk to her about this pt and then have her call me to discuss if found out anything about weather or not is compatible for MRI

## 2018-01-30 NOTE — Telephone Encounter (Signed)
I spoke with patient on Friday and the patient was advised that he can not have the MRI do to him having a pacemaker and that he would have to have a CT.  CB#[phone].  Thank you.

## 2018-02-03 NOTE — Telephone Encounter (Signed)
Order has been changed to CT

## 2018-02-04 ENCOUNTER — Ambulatory Visit (INDEPENDENT_AMBULATORY_CARE_PROVIDER_SITE_OTHER): Payer: Medicaid Other | Admitting: Orthopaedic Surgery

## 2018-02-11 ENCOUNTER — Telehealth (INDEPENDENT_AMBULATORY_CARE_PROVIDER_SITE_OTHER): Payer: Self-pay | Admitting: *Deleted

## 2018-02-11 NOTE — Telephone Encounter (Signed)
See below

## 2018-02-11 NOTE — Telephone Encounter (Signed)
Pt called wanting to know if he can get CT of R shoulder along with his R knee. He states his shoulder is cause pain and popping, says he talked about when in office last. Pt is having CT of R knee next Friday and would like to get both done at same time if possible.   I advised pt I did not see anywhere in notes where shoulder was mentioned and would probably have to come in and have doctor to evaluate it first before can getting CT.   Please advise  CB# [phone]

## 2018-02-11 NOTE — Telephone Encounter (Signed)
Yes unfortunately since we have not seen him for his shoulder or had any notes in any office visit about the shoulder, we would not be able to obtain a CT of his shoulder at the same setting.

## 2018-02-12 NOTE — Telephone Encounter (Signed)
See below, CB says no to the additional CT

## 2018-02-12 NOTE — Telephone Encounter (Signed)
Pt aware of message and will discuss at his follow up appt for CT review

## 2018-02-20 ENCOUNTER — Ambulatory Visit
Admission: RE | Admit: 2018-02-20 | Discharge: 2018-02-20 | Disposition: A | Discharge status: Home / Self Care | Payer: Medicaid Other | Source: Ambulatory Visit | Attending: Orthopaedic Surgery | Admitting: Orthopaedic Surgery

## 2018-02-20 DIAGNOSIS — M25561 Pain in right knee: Principal | ICD-10-CM

## 2018-02-20 DIAGNOSIS — G8929 Other chronic pain: Secondary | ICD-10-CM

## 2018-02-25 ENCOUNTER — Ambulatory Visit (INDEPENDENT_AMBULATORY_CARE_PROVIDER_SITE_OTHER): Payer: Medicaid Other | Admitting: Orthopaedic Surgery

## 2018-02-25 ENCOUNTER — Encounter (INDEPENDENT_AMBULATORY_CARE_PROVIDER_SITE_OTHER): Payer: Self-pay | Admitting: Orthopaedic Surgery

## 2018-02-25 DIAGNOSIS — M25511 Pain in right shoulder: Secondary | ICD-10-CM

## 2018-02-25 DIAGNOSIS — M25521 Pain in right elbow: Secondary | ICD-10-CM

## 2018-02-25 DIAGNOSIS — M25561 Pain in right knee: Secondary | ICD-10-CM | POA: Diagnosis not present

## 2018-02-25 DIAGNOSIS — G8929 Other chronic pain: Secondary | ICD-10-CM | POA: Insufficient documentation

## 2018-02-25 MED ORDER — METHYLPREDNISOLONE ACETATE 40 MG/ML IJ SUSP
40.0000 mg | INTRAMUSCULAR | Status: AC | PRN
  Administered 2018-02-25: 40 mg via INTRA_ARTICULAR

## 2018-02-25 MED ORDER — LIDOCAINE HCL 1 % IJ SOLN
3.0000 mL | INTRAMUSCULAR | Status: AC | PRN
  Administered 2018-02-25: 3 mL

## 2018-02-25 MED ORDER — LIDOCAINE HCL 1 % IJ SOLN
1.0000 mL | INTRAMUSCULAR | Status: AC | PRN
  Administered 2018-02-25: 1 mL

## 2018-02-25 NOTE — Progress Notes (Signed)
Office Visit Note   Patient: Omar Payne           Date of Birth: 04-23-1971           MRN: [id number] Visit Date: 02/25/2018              Requested by: Alfonse Spruce, FNP No address on file PCP: Alfonse Spruce, FNP   Assessment & Plan: Visit Diagnoses:  1. Chronic pain of right knee   2. Chronic right shoulder pain   3. Pain in right elbow     Plan: At this point I do feel that his ulcerative colitis is treated significant joint pains.  I do feel comfortable placing a steroid injection in his right shoulder subacromial space in his right elbow and he agreed with this and tolerated them well.  I would like to see him back in about 8 weeks to see how is doing overall especially considering he is starting new treatment for his ulcerative colitis.  Follow-Up Instructions: Return in about 2 months (around 04/27/2018).   Orders:  Orders Placed This Encounter  Procedures  . Large Joint Inj  . Medium Joint Inj   No orders of the defined types were placed in this encounter.     Procedures: Large Joint Inj: R subacromial bursa on 02/25/2018 3:10 PM Indications: pain and diagnostic evaluation Details: 22 G 1.5 in needle  Arthrogram: No  Medications: 3 mL lidocaine 1 %; 40 mg methylPREDNISolone acetate 40 MG/ML Outcome: tolerated well, no immediate complications Procedure, treatment alternatives, risks and benefits explained, specific risks discussed. Consent was given by the patient. Immediately prior to procedure a time out was called to verify the correct patient, procedure, equipment, support staff and site/side marked as required. Patient was prepped and draped in the usual sterile fashion.   Medium Joint Inj: R elbow on 02/25/2018 3:11 PM Medications: 1 mL lidocaine 1 %; 40 mg methylPREDNISolone acetate 40 MG/ML      Clinical Data: No additional findings.   Subjective: Chief Complaint  Patient presents with  . Right Knee - Follow-up  The patient is  following up after having a CT scan of his right knee.  He cannot have an MRI.  He does have a history of ulcerative colitis and does get significant joint issues and inflammation with this autoimmune disorder.  He is actually scheduled to start injections soon.  I did provide a steroid injection in his right knee he said since then that is really calm down the inflammation.  He gets a lot of popping in his joints in general and especially his right shoulder right elbow.  He like to consider injections in those areas today.  He says his knee is feeling better overall except for the popping which he can deal with.  He wants to be more active but it is hard for him to do that with his joint issues.  He is a very petite and thin individual.  HPI  Review of Systems  He currently denies any headache, chest pain, shortness of breath, fever, chills, nausea, vomiting. Objective: Vital Signs: There were no vitals taken for this visit.  Physical Exam He is alert and oriented x3 and in no acute distress Ortho Exam Examination of his right knee shows no effusion today.  His range of motion is full and his knee feels that this is stable.  Range of motion of his right shoulder and right elbow are full but painful. Specialty Comments:  No specialty comments available.  Imaging: No results found.   PMFS History: Patient Active Problem List   Diagnosis Date Noted  . Pain in right elbow 02/25/2018  . Chronic right shoulder pain 02/25/2018  . Chronic pain of right knee 02/25/2018  . Seizure ([place]) 03/16/2017  . Seizures ([place]) 03/14/2017  . Hypokalemia 03/14/2017  . AKI (acute kidney injury) ([place]) 03/14/2017  . Post-ictal confusion 03/14/2017  . Psychosis ([place]) 03/14/2017   Past Medical History:  Diagnosis Date  . Localization-related (focal) (partial) symptomatic epilepsy and epileptic syndromes with complex partial seizures, intractable, without status epilepticus ([place])     Family History    Problem Relation Age of Onset  . Hypertension Father   . Bipolar disorder Brother     Past Surgical History:  Procedure Laterality Date  . BRAIN SURGERY     right temporal lobectomy  . VNS     VNS implant   Social History   Occupational History  . Not on file  Tobacco Use  . Smoking status: Never Smoker  . Smokeless tobacco: Never Used  Substance and Sexual Activity  . Alcohol use: No  . Drug use: No  . Sexual activity: Not on file

## 2018-04-27 ENCOUNTER — Ambulatory Visit (INDEPENDENT_AMBULATORY_CARE_PROVIDER_SITE_OTHER): Payer: Medicaid Other | Admitting: Orthopaedic Surgery

## 2018-08-24 ENCOUNTER — Other Ambulatory Visit: Payer: Self-pay

## 2018-08-24 ENCOUNTER — Emergency Department (HOSPITAL_COMMUNITY): Payer: Medicare HMO

## 2018-08-24 ENCOUNTER — Emergency Department (HOSPITAL_COMMUNITY)
Admission: EM | Admit: 2018-08-24 | Discharge: 2018-08-24 | Disposition: A | Discharge status: Home / Self Care | Payer: Medicare HMO | Attending: Emergency Medicine | Admitting: Emergency Medicine

## 2018-08-24 ENCOUNTER — Encounter (HOSPITAL_COMMUNITY): Payer: Self-pay | Admitting: Emergency Medicine

## 2018-08-24 DIAGNOSIS — Z79899 Other long term (current) drug therapy: Secondary | ICD-10-CM | POA: Diagnosis not present

## 2018-08-24 DIAGNOSIS — Y939 Activity, unspecified: Secondary | ICD-10-CM | POA: Insufficient documentation

## 2018-08-24 DIAGNOSIS — W51XXXA Accidental striking against or bumped into by another person, initial encounter: Secondary | ICD-10-CM | POA: Insufficient documentation

## 2018-08-24 DIAGNOSIS — S4991XA Unspecified injury of right shoulder and upper arm, initial encounter: Secondary | ICD-10-CM | POA: Diagnosis present

## 2018-08-24 DIAGNOSIS — Y929 Unspecified place or not applicable: Secondary | ICD-10-CM | POA: Diagnosis not present

## 2018-08-24 DIAGNOSIS — S46911A Strain of unspecified muscle, fascia and tendon at shoulder and upper arm level, right arm, initial encounter: Secondary | ICD-10-CM | POA: Diagnosis not present

## 2018-08-24 DIAGNOSIS — Y998 Other external cause status: Secondary | ICD-10-CM | POA: Diagnosis not present

## 2018-08-24 MED ORDER — CYCLOBENZAPRINE HCL 5 MG PO TABS: 5.0000 mg | ORAL_TABLET | Freq: Three times a day (TID) | ORAL | 0 refills | Status: DC | PRN

## 2018-08-24 MED ORDER — CYCLOBENZAPRINE HCL 10 MG PO TABS
5.0000 mg | ORAL_TABLET | Freq: Once | ORAL | Status: AC
  Administered 2018-08-24: 5 mg via ORAL
  Filled 2018-08-24: qty 1

## 2018-08-24 NOTE — ED Notes (Signed)
Patient transported to X-ray 

## 2018-08-24 NOTE — ED Notes (Signed)
Patient verbalizes understanding of discharge instructions. Opportunity for questioning and answers were provided. Armband removed by staff, pt discharged from ED ambulatory.   
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
## 2018-08-24 NOTE — Discharge Instructions (Signed)
Take tylenol for pain.   Take flexeril for muscle spasms   See Dr. Debroah Loop office for follow up. You likely have a rotator cuff injury.   Wear sling for comfort, please try and move it as much as possible   Return to ER if you have worse shoulder pain, arm pain.
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
## 2018-08-26 ENCOUNTER — Other Ambulatory Visit: Payer: Self-pay | Admitting: Orthopedic Surgery

## 2018-08-26 DIAGNOSIS — M25511 Pain in right shoulder: Principal | ICD-10-CM

## 2018-08-26 DIAGNOSIS — G8929 Other chronic pain: Secondary | ICD-10-CM

## 2018-09-02 ENCOUNTER — Ambulatory Visit
Admission: RE | Admit: 2018-09-02 | Discharge: 2018-09-02 | Disposition: A | Discharge status: Home / Self Care | Payer: Medicare HMO | Source: Ambulatory Visit | Attending: Orthopedic Surgery | Admitting: Orthopedic Surgery

## 2018-09-02 ENCOUNTER — Other Ambulatory Visit: Payer: Self-pay

## 2018-09-02 DIAGNOSIS — M25511 Pain in right shoulder: Principal | ICD-10-CM

## 2018-09-02 DIAGNOSIS — G8929 Other chronic pain: Secondary | ICD-10-CM

## 2018-09-02 MED ORDER — IOPAMIDOL (ISOVUE-M 200) INJECTION 41%
15.0000 mL | Freq: Once | INTRAMUSCULAR | Status: AC
  Administered 2018-09-02: 15 mL via INTRA_ARTICULAR

## 2019-09-09 ENCOUNTER — Ambulatory Visit: Payer: Medicare HMO | Attending: Internal Medicine

## 2019-09-09 DIAGNOSIS — Z23 Encounter for immunization: Secondary | ICD-10-CM

## 2019-09-09 NOTE — Progress Notes (Signed)
   Covid-19 Vaccination Clinic  Name:  Omar Payne    MRN: [id number] DOB: 1970-10-09  09/09/2019  [name] was observed post Covid-19 immunization for 15 minutes without incident. He was provided with Vaccine Information Sheet and instruction to access the V-Safe system.   [name] was instructed to call 911 with any severe reactions post vaccine: Marland Kitchen Difficulty breathing  . Swelling of face and throat  . A fast heartbeat  . A bad rash all over body  . Dizziness and weakness   Immunizations Administered    Name Date Dose VIS Date Route   Moderna COVID-19 Vaccine 09/09/2019 10:12 AM 0.5 mL 05/18/2019 Intramuscular   Manufacturer: Moderna   Lot: [id number]   [id number]

## 2019-10-12 ENCOUNTER — Ambulatory Visit: Payer: Medicare HMO

## 2019-10-13 ENCOUNTER — Ambulatory Visit: Payer: Medicare HMO | Attending: Internal Medicine

## 2019-10-13 ENCOUNTER — Other Ambulatory Visit: Payer: Self-pay

## 2019-10-13 DIAGNOSIS — Z23 Encounter for immunization: Secondary | ICD-10-CM

## 2019-10-13 NOTE — Progress Notes (Signed)
   Covid-19 Vaccination Clinic  Name:  Omar Payne    MRN: [id number] DOB: 08-03-1970  10/13/2019  [name] was observed post Covid-19 immunization for 15 minutes without incident. He was provided with Vaccine Information Sheet and instruction to access the V-Safe system.   [name] was instructed to call 911 with any severe reactions post vaccine: Marland Kitchen Difficulty breathing  . Swelling of face and throat  . A fast heartbeat  . A bad rash all over body  . Dizziness and weakness   Immunizations Administered    Name Date Dose VIS Date Route   Moderna COVID-19 Vaccine 10/13/2019  8:34 AM 0.5 mL 05/2019 Intramuscular   Manufacturer: Moderna   Lot: [id number]   [id number]

## 2020-10-30 IMAGING — DX RIGHT SHOULDER - 2+ VIEW
3 series · 3 of 3 positions shown · non-contrast
Comparison: None.

CLINICAL DATA: Right shoulder injury a month ago.  Continued pain.

EXAM:
RIGHT SHOULDER - 2+ VIEW

[shoulder grashey]
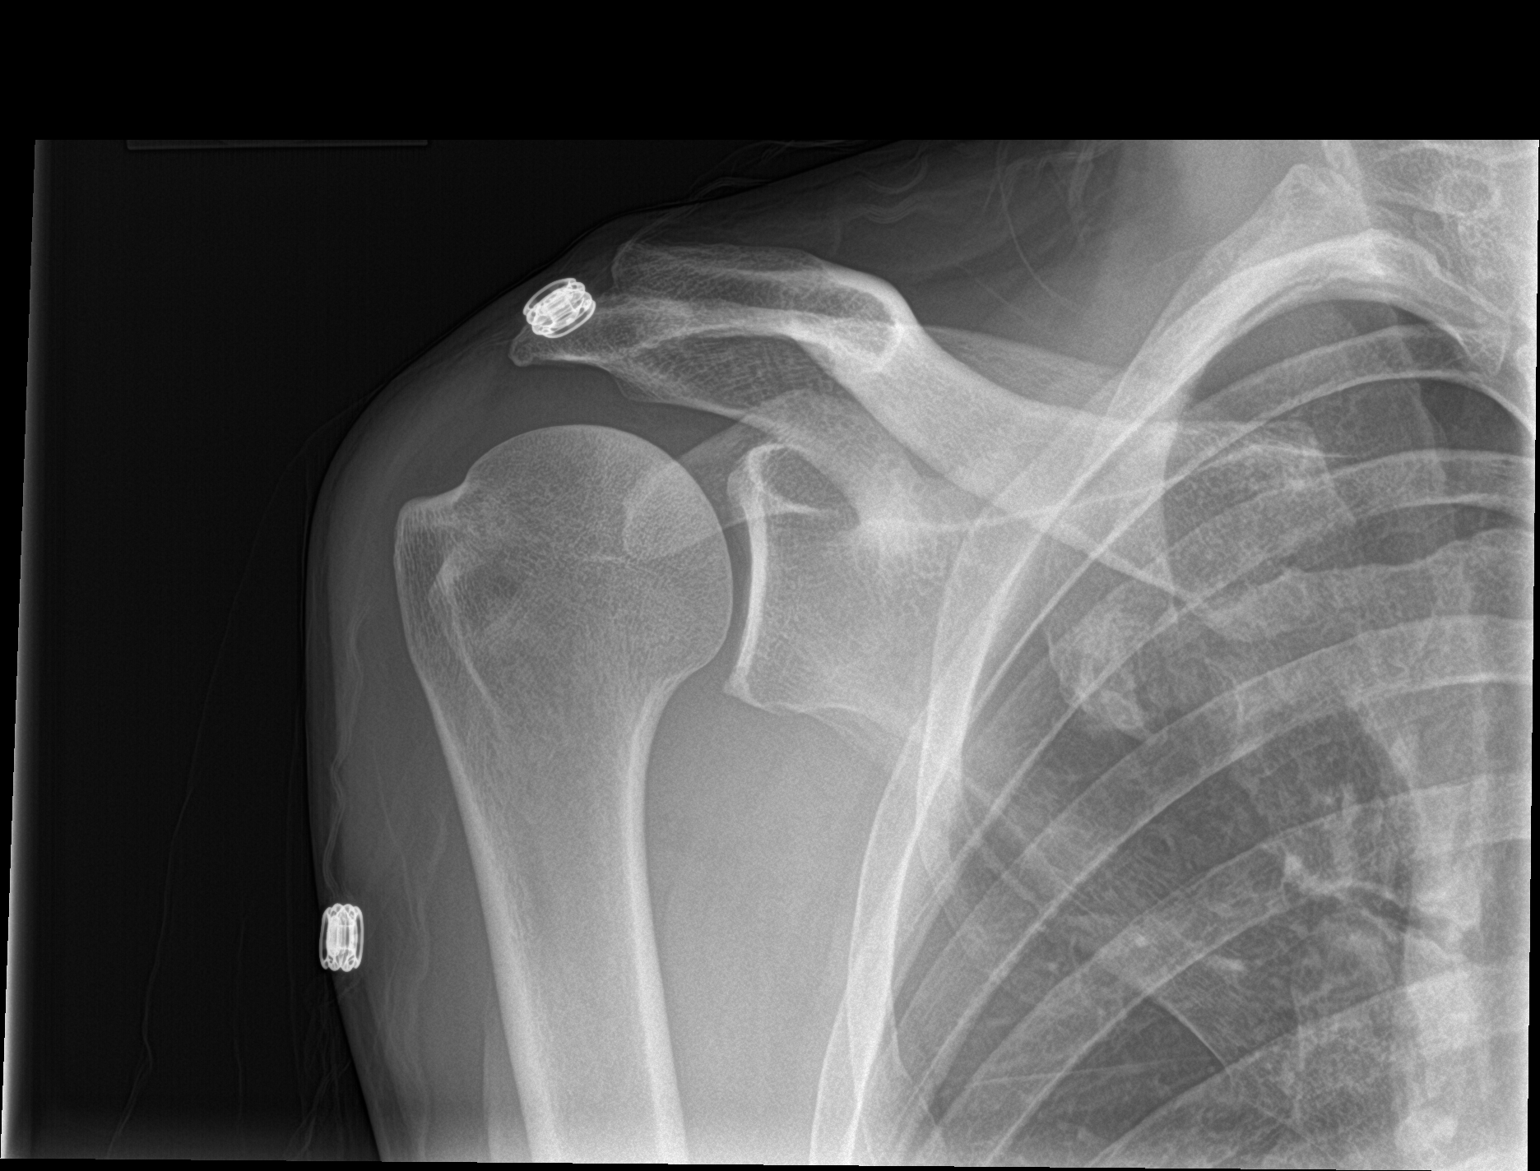

[shoulder y view]
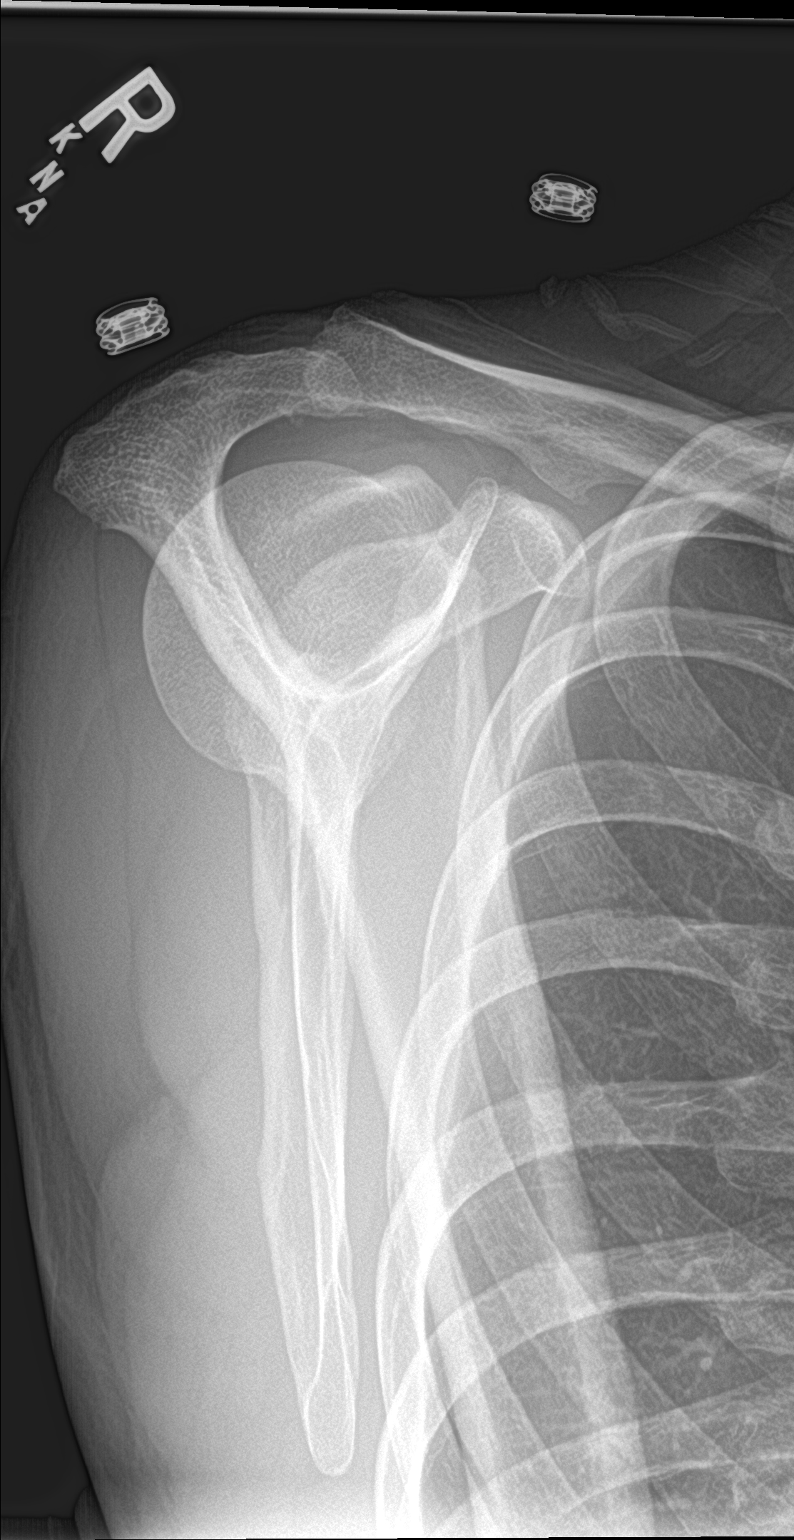

[shoulder axillary]
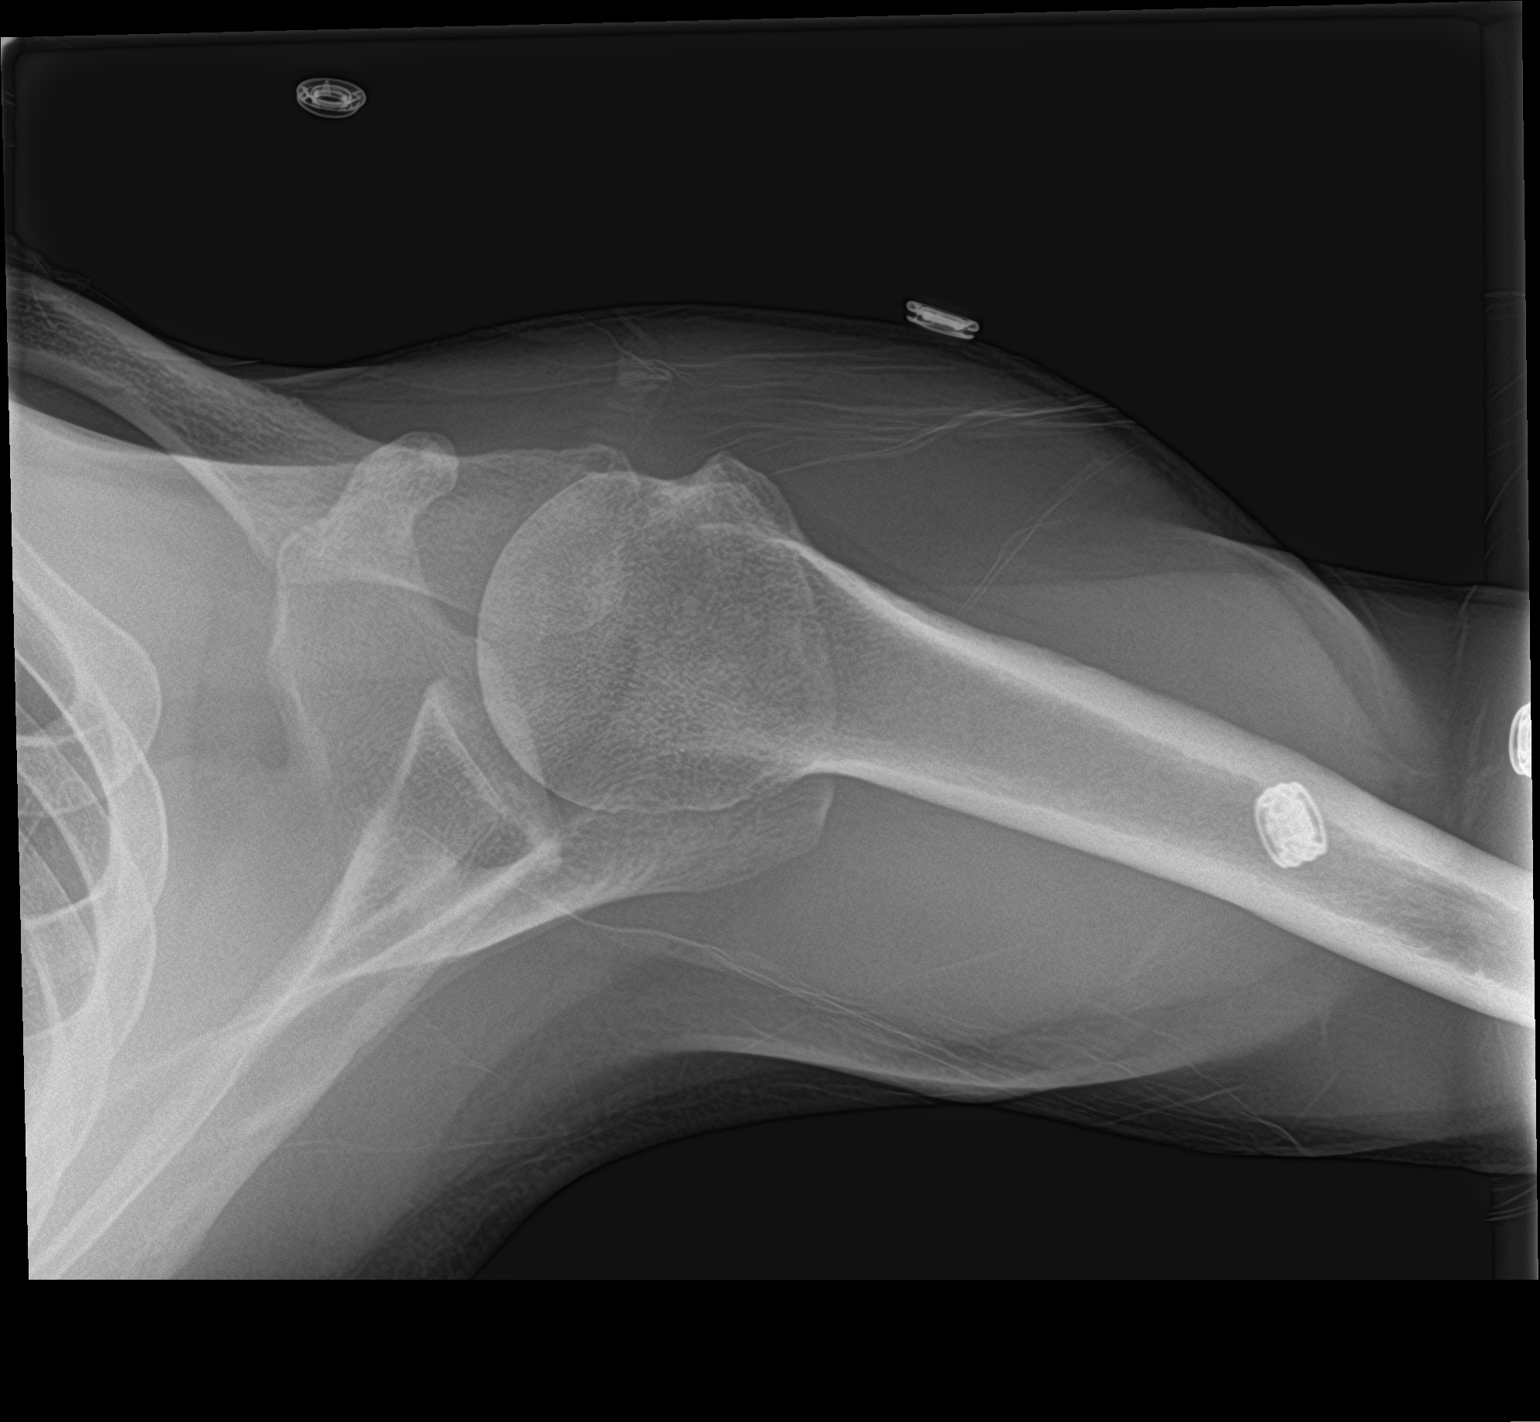

[3 of 3 positions shown; findings below may reference images not displayed]

FINDINGS: There is no evidence of fracture or dislocation. There is no
evidence of arthropathy or other focal bone abnormality. Soft
tissues are unremarkable.
IMPRESSION: Negative.

## 2020-11-08 IMAGING — XA FLUORO GUIDED NEEDLE PLACEMENT
1 series · 1 of 1 positions shown · non-contrast
Comparison: none

CLINICAL DATA: RIGHT shoulder pain.

[Series 1: ortho adipose · 1 of 1 slices shown]
[im 1/1]
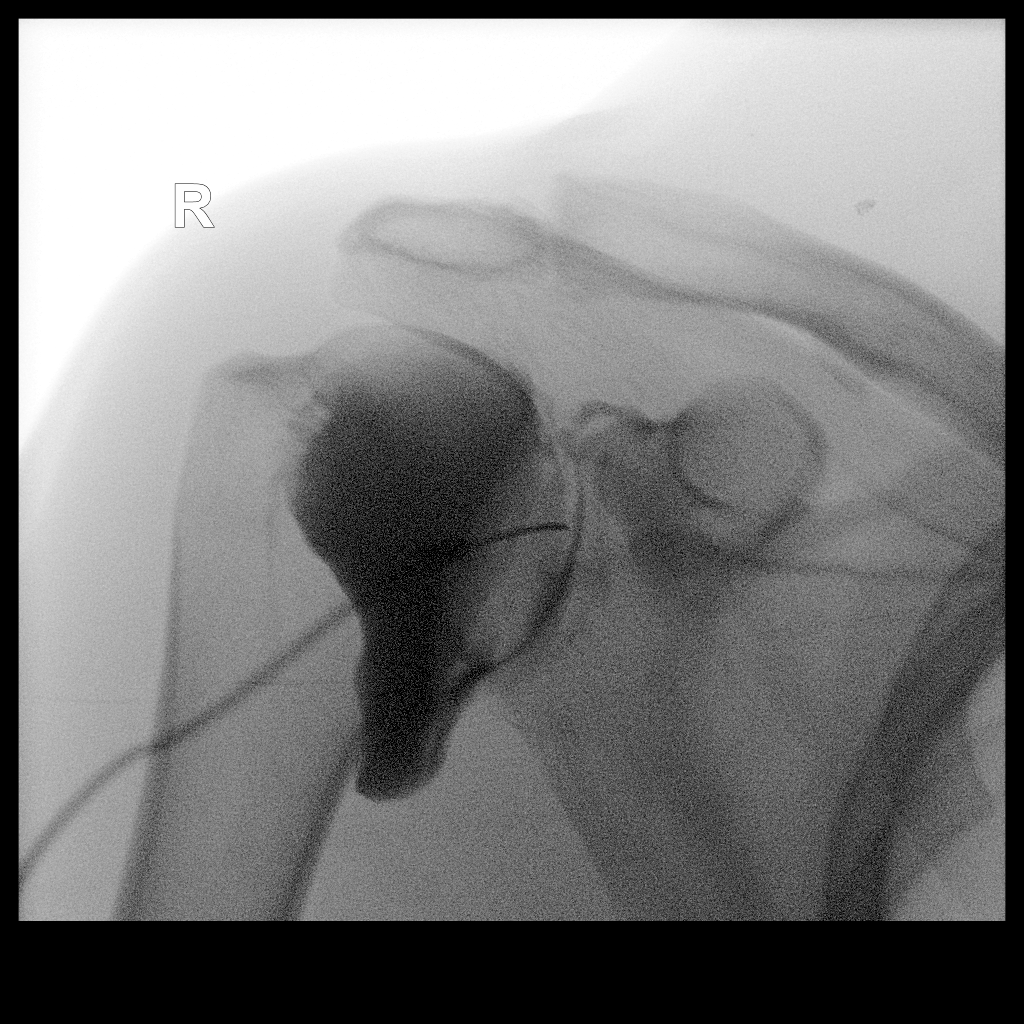

[1 of 1 positions shown; findings below may reference images not displayed]

FLUOROSCOPY TIME:  6 seconds corresponding to a Dose Area Product of
3.77 Gy*m2

PROCEDURE:
RIGHT SHOULDER INJECTION UNDER FLUOROSCOPY

Informed written consent was obtained.  Time-out was performed.

An appropriate skin entry site was chosen, cleansed with Betadine,
and anesthetized with 1% lidocaine.

22 gauge spinal needle was advanced to the superomedial margin of
the humeral head under intermittent fluoroscopy. 1 ml of Lidocaine
injected easily. A mixture of dilute Isovue M 200, 10 mL contrast
with 10 mL saline, was then used to opacify the RIGHT shoulder
capsule. No immediate complication.
IMPRESSION: Technically successful RIGHT shoulder injection for MRI.

## 2022-12-31 LAB — HM COLONOSCOPY

## 2023-10-27 DIAGNOSIS — Z419 Encounter for procedure for purposes other than remedying health state, unspecified: Secondary | ICD-10-CM | POA: Diagnosis not present

## 2023-11-17 ENCOUNTER — Ambulatory Visit: Payer: Self-pay

## 2023-11-17 NOTE — Telephone Encounter (Signed)
 Chief Complaint: sinus congestion Symptoms: sore throat, stuffy nose Frequency: starting 3rd day today Pertinent Negatives: Patient denies fever, difficulty breathing,  Disposition: [] ED /[x] Urgent Care (no appt availability in office) / [] Appointment(In office/virtual)/ []  [facility]/ [] Home Care/ [] Refused Recommended Disposition /[] [facility]/ []  Follow-up with PCP Additional Notes: Pt requesting to be seen for his sinus congestion and sore throat, denies fever. States hx of sinus infections. Pt is not established with [facility], would like to be seen at [facility], no appts until sept fr new pt visit.  Copied from CRM [id number]. Topic: Clinical - Red Word Triage >> Nov 17, 2023  8:50 AM [id number] wrote: Sinus are messing up and wants blood pressure checked. Says blood pressure is high but don't know the numbers. Sinus problems for 3 days. Answer Assessment - Initial Assessment Questions 1. LOCATION: "Where does it hurt?"      Stuffed up and throat real scratchy 2. ONSET: "When did the sinus pain start?"  (e.g., hours, days)      3rd day 3. SEVERITY: "How bad is the pain?"   (Scale 1-10; mild, moderate or severe)   - MILD (1-3): doesn't interfere with normal activities    - MODERATE (4-7): interferes with normal activities (e.g., work or school) or awakens from sleep   - SEVERE (8-10): excruciating pain and patient unable to do any normal activities        6 in throat 4. RECURRENT SYMPTOM: "Have you ever had sinus problems before?" If Yes, ask: "When was the last time?" and "What happened that time?"      Yes dx with sinus infection 5. NASAL CONGESTION: "Is the nose blocked?" If Yes, ask: "Can you open it or must you breathe through your mouth?"     Yes 7. FEVER: "Do you have a fever?" If Yes, ask: "What is it, how was it measured, and when did it start?"      denies 8. OTHER SYMPTOMS: "Do you have any other symptoms?" (e.g., sore throat, cough, earache, difficulty  breathing)     Cough, sore throat,  Protocols used: Sinus Pain or Congestion-A-AH  Reason for Disposition  [1] Sinus congestion as part of a cold AND [2] present < 10 days  Answer Assessment - Initial Assessment Questions 1. LOCATION: "Where does it hurt?"      Stuffed up and throat real scratchy 2. ONSET: "When did the sinus pain start?"  (e.g., hours, days)      3rd day 3. SEVERITY: "How bad is the pain?"   (Scale 1-10; mild, moderate or severe)   - MILD (1-3): doesn't interfere with normal activities    - MODERATE (4-7): interferes with normal activities (e.g., work or school) or awakens from sleep   - SEVERE (8-10): excruciating pain and patient unable to do any normal activities        6 in throat 4. RECURRENT SYMPTOM: "Have you ever had sinus problems before?" If Yes, ask: "When was the last time?" and "What happened that time?"      Yes dx with sinus infection 5. NASAL CONGESTION: "Is the nose blocked?" If Yes, ask: "Can you open it or must you breathe through your mouth?"     Yes 7. FEVER: "Do you have a fever?" If Yes, ask: "What is it, how was it measured, and when did it start?"      denies 8. OTHER SYMPTOMS: "Do you have any other symptoms?" (e.g., sore throat, cough, earache, difficulty breathing)  Cough, sore throat,  Protocols used: Sinus Pain or Congestion-A-AH

## 2023-12-16 ENCOUNTER — Ambulatory Visit: Admitting: General Practice

## 2024-06-01 ENCOUNTER — Encounter: Payer: Self-pay | Admitting: General Practice

## 2024-06-02 ENCOUNTER — Encounter: Payer: Self-pay | Admitting: General Practice

## 2024-06-02 ENCOUNTER — Ambulatory Visit: Payer: Self-pay | Admitting: General Practice

## 2024-06-02 VITALS — BP 122/84 | HR 76 | Temp 98.1°F | Ht 68.5 in | Wt 177.0 lb

## 2024-06-02 DIAGNOSIS — K5190 Ulcerative colitis, unspecified, without complications: Secondary | ICD-10-CM | POA: Insufficient documentation

## 2024-06-02 DIAGNOSIS — R300 Dysuria: Secondary | ICD-10-CM | POA: Insufficient documentation

## 2024-06-02 DIAGNOSIS — N3001 Acute cystitis with hematuria: Secondary | ICD-10-CM | POA: Diagnosis not present

## 2024-06-02 DIAGNOSIS — G40219 Localization-related (focal) (partial) symptomatic epilepsy and epileptic syndromes with complex partial seizures, intractable, without status epilepticus: Secondary | ICD-10-CM | POA: Insufficient documentation

## 2024-06-02 DIAGNOSIS — K5130 Ulcerative (chronic) rectosigmoiditis without complications: Secondary | ICD-10-CM | POA: Diagnosis not present

## 2024-06-02 DIAGNOSIS — E782 Mixed hyperlipidemia: Secondary | ICD-10-CM

## 2024-06-02 DIAGNOSIS — I10 Essential (primary) hypertension: Secondary | ICD-10-CM | POA: Diagnosis not present

## 2024-06-02 DIAGNOSIS — Z7689 Persons encountering health services in other specified circumstances: Secondary | ICD-10-CM | POA: Insufficient documentation

## 2024-06-02 DIAGNOSIS — R569 Unspecified convulsions: Secondary | ICD-10-CM

## 2024-06-02 DIAGNOSIS — E559 Vitamin D deficiency, unspecified: Secondary | ICD-10-CM | POA: Diagnosis not present

## 2024-06-02 LAB — POC URINALSYSI DIPSTICK (AUTOMATED)
Bilirubin, UA: 2
Blood, UA: 10
Glucose, UA: POSITIVE — AB
Ketones, UA: 5
Nitrite, UA: POSITIVE
Protein, UA: NEGATIVE
Spec Grav, UA: 1.025 (ref 1.010–1.025)
Urobilinogen, UA: 4 U/dL — AB
pH, UA: 5 (ref 5.0–8.0)

## 2024-06-02 MED ORDER — SULFAMETHOXAZOLE-TRIMETHOPRIM 800-160 MG PO TABS: 1.0000 | ORAL_TABLET | Freq: Two times a day (BID) | ORAL | 0 refills | Status: AC

## 2024-06-02 NOTE — Patient Instructions (Signed)
 Stop by the lab prior to leaving today. I will notify you of your results once received.    Start Bactrim  DS (sulfamethoxazole /trimethoprim ) tablets for urinary tract infection. Take 1 tablet by mouth twice daily for 5 days.   Follow up in 2-3 weeks to make sure infection as cleared.   It was a pleasure to meet you today! Please don't hesitate to contact me with any questions. Welcome to [facility]!

## 2024-06-02 NOTE — Assessment & Plan Note (Signed)
 EMR reviewed briefly.

## 2024-06-02 NOTE — Progress Notes (Addendum)
 New Patient Office Visit  Subjective    Patient ID: Keyen E Lovingood, male    DOB: [birth year]  Age: 53 y.o. MRN: [id number]  CC:  Chief Complaint  Patient presents with   New Patient (Initial Visit)    Establish care   Dysuria    X 3 weeks. Patient was given abx x 7 days and did not help. Patient has some back pain, burning with urination     HPI Eithen E Balestrieri is a 53 y.o. male presents to establish care.  Previous PCP/physical/labs: [facility]; physical and labs were in April 2025.   Discussed the use of AI scribe software for clinical note transcription with the patient, who gave verbal consent to proceed.  History of Present Illness Dequavion E Aken is a 53 year old male who presents with dysuria and possible urinary tract infection.  He has been experiencing dysuria characterized by a burning sensation during urination for the past three weeks. Initial treatment with doxycycline for seven days was ineffective. No pain with urination, urgency, frequency, flank pain, blood in urine, nausea, or vomiting. He has a history of kidney stones and underwent a urinalysis and ultrasound at [facility] to check for kidney stones, but results are pending.  He is currently taking atorvastatin for cholesterol, lisinopril for high blood pressure, Flonase  for allergies, and lacosamide for seizures. He follows up with Dr. Prentice Sar for epilepsy management and has a VNS device installed. He has a history of brain surgery and is on disability. He has stopped drinking alcohol, which he believes contributed to his seizures.  He has a history of ulcerative colitis and receives Entyvio injections twice a month. He previously received infusions monthly but now uses a pen for administration at home. He had a colonoscopy last year while incarcerated, which confirmed the diagnosis of ulcerative colitis.  He mentions a past vitamin D deficiency and recalls being on vitamin D supplements while  incarcerated.     Outpatient Encounter Medications as of 06/02/2024  Medication Sig   atorvastatin (LIPITOR) 20 MG tablet Take 1 tablet by mouth daily.   fluticasone  (FLONASE ) 50 MCG/ACT nasal spray Place 2 sprays into both nostrils daily.   HYDROcodone -acetaminophen  (NORCO) 7.5-325 MG tablet    lacosamide (VIMPAT) 200 MG TABS tablet Take 200 mg by mouth.   lisinopril (ZESTRIL) 20 MG tablet Take 20 mg by mouth daily.   sulfamethoxazole -trimethoprim  (BACTRIM  DS) 800-160 MG tablet Take 1 tablet by mouth 2 (two) times daily for 5 days. For urinary tract infection.   valACYclovir (VALTREX) 500 MG tablet    Vedolizumab (ENTYVIO PEN) 108 MG/0.68ML SOAJ Inject 108 mg into the skin.   [DISCONTINUED] acetaminophen  (TYLENOL ) 500 MG tablet Take 2 tablets (1,000 mg total) by mouth every 6 (six) hours as needed.   [DISCONTINUED] Aloe-Sodium Chloride  (AYR SALINE NASAL GEL NA) Place 1 application into the nose 2 (two) times daily as needed.   [DISCONTINUED] ciclopirox  (PENLAC ) 8 % solution Apply at bedtime topically. Apply over nail and surrounding skin. Apply daily over previous coat. After seven (7) days, may remove with alcohol and continue cycle. (Patient not taking: Reported on 07/18/2017)   [DISCONTINUED] cyclobenzaprine  (FLEXERIL ) 5 MG tablet Take 1 tablet (5 mg total) by mouth 3 (three) times daily as needed.   [DISCONTINUED] Elastic Bandages & Supports (KNEE BRACE) MISC APPLY ONE KNEE BRACE TO RIGHT KNEE FOR STABILITY AND SUPPORT. TO BE FITTED BY MEDICAL SUPPLY. (Patient not taking: Reported on 11/27/2017)   [DISCONTINUED] famotidine (  PEPCID) 20 MG tablet Take 20 mg by mouth. (Patient not taking: Reported on 06/02/2024)   [DISCONTINUED] haloperidol  (HALDOL ) 0.5 MG tablet Take 1/2 tablet (Patient not taking: Reported on 07/18/2017)   [DISCONTINUED] hydrochlorothiazide (MICROZIDE) 12.5 MG capsule Take 25 mg by mouth. (Patient not taking: Reported on 06/02/2024)   [DISCONTINUED] hydrocortisone  (ANUSOL -HC) 25  MG suppository Place 1 suppository (25 mg total) rectally 2 (two) times daily. (Patient not taking: Reported on 07/18/2017)   [DISCONTINUED] levETIRAcetam  (KEPPRA ) 750 MG tablet Take 2 tablets (1,500 mg total) by mouth 2 (two) times daily. Please give immediate release version/cheaper   [DISCONTINUED] LIALDA  1.2 g EC tablet Take 2.4 g by mouth daily.   [DISCONTINUED] mesalamine  (CANASA ) 1000 MG suppository Place 1 suppository (1,000 mg total) rectally at bedtime. (Patient not taking: Reported on 11/27/2017)   [DISCONTINUED] mirtazapine (REMERON) 30 MG tablet Take 30 mg by mouth. (Patient not taking: Reported on 06/02/2024)   [DISCONTINUED] Phenylephrine -Chlorphen-DM 10-16-08 MG/5ML SYRP Take 5 mLs by mouth every 6 (six) hours as needed. (Patient not taking: Reported on 07/18/2017)   [DISCONTINUED] zonisamide  (ZONEGRAN ) 100 MG capsule Take 6 capsules (600 mg total) by mouth daily. (Patient not taking: Reported on 11/27/2017)   No facility-administered encounter medications on file as of 06/02/2024.    Past Medical History:  Diagnosis Date   Hypertension    Localization-related (focal) (partial) symptomatic epilepsy and epileptic syndromes with complex partial seizures, intractable, without status epilepticus (HCC)    Ulcer     Past Surgical History:  Procedure Laterality Date   BRAIN SURGERY     right temporal lobectomy   VNS     VNS implant    Family History  Problem Relation Age of Onset   Hypertension Father    Anxiety disorder Father    ADD / ADHD Father    Bipolar disorder Brother    Anxiety disorder Brother    ADD / ADHD Brother    Arthritis Mother    Arthritis Paternal Grandmother    Arthritis Paternal Uncle    Asthma Son    ADD / ADHD Son    Arthritis Paternal Aunt     Social History   Socioeconomic History   Marital status: Married    Spouse name: Not on file   Number of children: Not on file   Years of education: Not on file   Highest education level: GED or  equivalent  Occupational History   Not on file  Tobacco Use   Smoking status: Former    Current packs/day: 0.00    Average packs/day: 0.5 packs/day for 20.0 years (10.0 ttl pk-yrs)    Types: Cigarettes    Quit date: 06/17/2002    Years since quitting: 21.9   Smokeless tobacco: Never  Substance and Sexual Activity   Alcohol use: Never   Drug use: No   Sexual activity: Yes    Birth control/protection: None  Other Topics Concern   Not on file  Social History Narrative   Not on file   Social Drivers of Health   Tobacco Use: Medium Risk (06/02/2024)   Patient History    Smoking Tobacco Use: Former    Smokeless Tobacco Use: Never    Passive Exposure: Not on file  Financial Resource Strain: Medium Risk (05/29/2024)   Overall Financial Resource Strain (CARDIA)    Difficulty of Paying Living Expenses: Somewhat hard  Food Insecurity: No Food Insecurity (05/29/2024)   Epic    Worried About Running Out of Food in the Last  Year: Never true    Ran Out of Food in the Last Year: Never true  Transportation Needs: No Transportation Needs (05/29/2024)   Epic    Lack of Transportation (Medical): No    Lack of Transportation (Non-Medical): No  Physical Activity: Sufficiently Active (05/29/2024)   Exercise Vital Sign    Days of Exercise per Week: 5 days    Minutes of Exercise per Session: 60 min  Stress: Stress Concern Present (05/29/2024)   [employer] of Occupational Health - Occupational Stress Questionnaire    Feeling of Stress: To some extent  Social Connections: Moderately Isolated (05/29/2024)   Social Connection and Isolation Panel    Frequency of Communication with Friends and Family: More than three times a week    Frequency of Social Gatherings with Friends and Family: Once a week    Attends Religious Services: 1 to 4 times per year    Active Member of [employer] or Organizations: No    Attends Engineer, Structural: Not on file    Marital Status: Separated  Intimate  Partner Violence: Not At Risk (02/06/2024)   Received from [facility]   HITS    Over the last 12 months how often did your partner physically hurt you?: Rarely    Over the last 12 months how often did your partner insult you or talk down to you?: Rarely    Over the last 12 months how often did your partner threaten you with physical harm?: Never    Over the last 12 months how often did your partner scream or curse at you?: Never  Depression (PHQ2-9): Low Risk (06/02/2024)   Depression (PHQ2-9)    PHQ-2 Score: 0  Alcohol Screen: Not on file  Housing: High Risk (05/29/2024)   Epic    Unable to Pay for Housing in the Last Year: No    Number of Times Moved in the Last Year: 3    Homeless in the Last Year: No  Utilities: Not At Risk (02/06/2024)   Received from [facility]    In the past 12 months has the electric, gas, oil, or water company threatened to shut off services in your home?: No  Health Literacy: Not on file    Review of Systems  Constitutional:  Negative for chills and fever.  Respiratory:  Negative for shortness of breath.   Cardiovascular:  Negative for chest pain.  Gastrointestinal:  Negative for abdominal pain, constipation, diarrhea, heartburn, nausea and vomiting.  Genitourinary:  Positive for dysuria and flank pain. Negative for frequency, hematuria and urgency.  Neurological:  Negative for dizziness and headaches.  Endo/Heme/Allergies:  Negative for polydipsia.  Psychiatric/Behavioral:  Negative for depression and suicidal ideas. The patient is not nervous/anxious.         Objective    BP 122/84   Pulse 76   Temp 98.1 F (36.7 C) (Temporal)   Ht 5' 8.5 (1.74 m)   Wt 177 lb (80.3 kg)   SpO2 98%   BMI 26.52 kg/m   Physical Exam Vitals and nursing note reviewed.  Constitutional:      Appearance: Normal appearance.  Cardiovascular:     Rate and Rhythm: Normal rate and regular rhythm.     Pulses: Normal pulses.     Heart sounds: Normal  heart sounds.  Pulmonary:     Effort: Pulmonary effort is normal.     Breath sounds: Normal breath sounds.  Abdominal:     Tenderness: There is no  right CVA tenderness or left CVA tenderness.  Neurological:     Mental Status: He is alert and oriented to person, place, and time.  Psychiatric:        Mood and Affect: Mood normal.        Behavior: Behavior normal.        Thought Content: Thought content normal.        Judgment: Judgment normal.         Assessment & Plan:  Dysuria -     POCT Urinalysis Dipstick (Automated) -     Urine Culture -     CBC with Differential/Platelet -     Comprehensive metabolic panel with GFR -     Hemoglobin A1c  Establishing care with new doctor, encounter for Assessment & Plan: EMR reviewed briefly.     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 For urinary tract infection.  Dispense: 10 tablet; Refill: 0  Mixed hyperlipidemia  Vitamin D deficiency  Primary hypertension    Assessment and Plan Assessment & Plan Acute cystitis with hematuria Dysuria with burning sensation. Previous doxycycline ineffective.  Hematuria possibly related to kidney stones. - UA shows 3+ leuk's, nitrites, microscopic blood.  - Sent urinalysis for culture.  - Prescribed Bactrim , one tablet twice daily for five days. - Consider urology referral if no improvement post-antibiotics. - Advised emergency care if fever, nausea, chills, vomiting, or severe back pain develop.  Ulcerative rectosigmoiditis Managed with Entyvio injections. - Continue Entyvio injections every two weeks. - Following with GI; reviewed notes.   Localization-related epilepsy, intractable,  without status epilepticus Managed with Keppra  and Vimpat. Previous psychosis resolved. - Continue current epilepsy medication regimen. - Following with neurology. Reviewed notes from August.   Primary hypertension Managed with lisinopril. - BP at goal.  - Continue lisinopril.  Mixed hyperlipidemia Managed with atorvastatin. - Lipid panel at next visit. - Continue atorvastatin.  Vitamin D deficiency Previously treated with supplementation. Not currently on supplementation.  Return in about 3 weeks (around 06/23/2024) for uti f/u.   Carrol Aurora, NP

## 2024-06-03 LAB — CBC WITH DIFFERENTIAL/PLATELET
Basophils Absolute: 0.1 K/uL (ref 0.0–0.1)
Basophils Relative: 1.1 % (ref 0.0–3.0)
Eosinophils Absolute: 0.2 K/uL (ref 0.0–0.7)
Eosinophils Relative: 3.3 % (ref 0.0–5.0)
HCT: 43.1 % (ref 39.0–52.0)
Hemoglobin: 14.9 g/dL (ref 13.0–17.0)
Lymphocytes Relative: 39.3 % (ref 12.0–46.0)
Lymphs Abs: 2.5 K/uL (ref 0.7–4.0)
MCHC: 34.5 g/dL (ref 30.0–36.0)
MCV: 91.7 fl (ref 78.0–100.0)
Monocytes Absolute: 0.4 K/uL (ref 0.1–1.0)
Monocytes Relative: 6.6 % (ref 3.0–12.0)
Neutro Abs: 3.2 K/uL (ref 1.4–7.7)
Neutrophils Relative %: 49.7 % (ref 43.0–77.0)
Platelets: 336 K/uL (ref 150.0–400.0)
RBC: 4.7 Mil/uL (ref 4.22–5.81)
RDW: 12.7 % (ref 11.5–15.5)
WBC: 6.4 K/uL (ref 4.0–10.5)

## 2024-06-03 LAB — COMPREHENSIVE METABOLIC PANEL WITH GFR
ALT: 18 U/L (ref 3–53)
AST: 18 U/L (ref 5–37)
Albumin: 4.7 g/dL (ref 3.5–5.2)
Alkaline Phosphatase: 67 U/L (ref 39–117)
BUN: 16 mg/dL (ref 6–23)
CO2: 28 meq/L (ref 19–32)
Calcium: 9.4 mg/dL (ref 8.4–10.5)
Chloride: 103 meq/L (ref 96–112)
Creatinine, Ser: 1.08 mg/dL (ref 0.40–1.50)
GFR: 79.49 mL/min (ref >60.00)
Glucose, Bld: 81 mg/dL (ref 70–99)
Potassium: 4.1 meq/L (ref 3.5–5.1)
Sodium: 140 meq/L (ref 135–145)
Total Bilirubin: 0.7 mg/dL (ref 0.2–1.2)
Total Protein: 6.8 g/dL (ref 6.0–8.3)

## 2024-06-03 LAB — URINE CULTURE
MICRO NUMBER:: 17368750
Result:: NO GROWTH
SPECIMEN QUALITY:: ADEQUATE

## 2024-06-03 LAB — HEMOGLOBIN A1C: Hgb A1c MFr Bld: 5.7 % (ref 4.6–6.5)

## 2024-06-22 ENCOUNTER — Ambulatory Visit: Admitting: General Practice

## 2024-06-23 ENCOUNTER — Ambulatory Visit: Admitting: General Practice

## 2024-06-23 ENCOUNTER — Encounter: Payer: Self-pay | Admitting: General Practice

## 2024-06-23 VITALS — BP 124/80 | HR 86 | Ht 68.5 in | Wt 175.0 lb

## 2024-06-23 DIAGNOSIS — K5130 Ulcerative (chronic) rectosigmoiditis without complications: Secondary | ICD-10-CM | POA: Diagnosis not present

## 2024-06-23 DIAGNOSIS — R7303 Prediabetes: Secondary | ICD-10-CM | POA: Diagnosis not present

## 2024-06-23 DIAGNOSIS — R051 Acute cough: Secondary | ICD-10-CM

## 2024-06-23 DIAGNOSIS — N200 Calculus of kidney: Secondary | ICD-10-CM

## 2024-06-23 DIAGNOSIS — R569 Unspecified convulsions: Secondary | ICD-10-CM

## 2024-06-23 MED ORDER — PROMETHAZINE-DM 6.25-15 MG/5ML PO SYRP: 5.0000 mL | ORAL_SOLUTION | Freq: Four times a day (QID) | ORAL | 0 refills | Status: DC | PRN

## 2024-06-23 NOTE — Progress Notes (Addendum)
 "  Established Patient Office Visit  Subjective   Patient ID: Omar Payne, male    DOB: [birth year]  Age: 54 y.o. MRN: [id number]  Chief Complaint  Patient presents with   Follow-up    Pt is here today to be seen for a 3week follow up on a UTI. Pt reports that he is feeling better and has no urinary symptoms.    HPI  Omar Payne is a 54 year old male with past medical history of UC, post - ictal confusion, epilepscy, seizures, chronic pain, anxiety and depression presents today for a follow up.   Discussed the use of AI scribe software for clinical note transcription with the patient, who gave verbal consent to proceed.  History of Present Illness Omar Payne is a 54 year old male who presents for follow-up after treatment for dysuria.  He was evaluated on June 02, 2024, for dysuria and was previously treated with doxycycline for a urinary tract infection at [facility], completing a seven-day course without improvement. A urinalysis on June 02, 2024, showed moderate leukocytes, positive nitrites, and trace blood, but the urine culture was negative for bacteria. He was then treated with Bactrim  800-160 mg BID for five days. Today he reports no urinary symptoms, including painful or burning urination, penile discharge, fever, chills, nausea, vomiting, or diarrhea.  He has a history of kidney stones, with six stones on the right side and two on the left side. He is following up with a urologist at [facility] for further evaluation, including a possible cystoscopy, as an ultrasound suggested a potential kidney stone on the left side.  He was found to have prediabetes, with an A1c of 5.7. he has started monitoring his diet.   He has a history of ulcerative colitis, specifically rectosigmoiditis, and follows up with a gastroenterologist at [facility]. He undergoes annual colonoscopies due to his condition. His last colonoscopy was a few years while in prison.   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 He follows up with neurology annually.  He reports a recent onset of a cough, starting three days ago, with a scratchy throat, waking him up at night. No COVID-19 exposure. He is using over-the-counter decongestants. Denies any fever, chills, nasal congestion.   He is not currently using hydrocodone , which was previously prescribed for back pain flare-ups.  He is taking atorvastatin for cholesterol management.     Patient Active Problem List   Diagnosis Date Noted   Establishing care with new doctor, encounter for 06/02/2024   Dysuria 06/02/2024   Localization-related symptomatic epilepsy and epileptic syndromes with complex partial seizures, intractable, without status epilepticus (HCC) 06/02/2024   Ulcerative colitis without complications (HCC) 06/02/2024   Acute cystitis with hematuria 06/02/2024   Nonintractable epilepsy (HCC) 03/11/2019   Pain in right elbow 02/25/2018   Chronic right shoulder pain 02/25/2018   Chronic pain of right knee 02/25/2018   Seizure (HCC) 03/16/2017   Seizures (HCC) 03/14/2017   Hypokalemia 03/14/2017   AKI (acute kidney injury) 03/14/2017   Post-ictal confusion 03/14/2017   Adjustment reaction with anxiety and depression 02/05/2013   Past Medical History:  Diagnosis Date   Hypertension    Localization-related (focal) (partial) symptomatic epilepsy and epileptic syndromes with complex partial seizures, intractable, without status epilepticus (HCC)    Ulcer    Past Surgical History:  Procedure Laterality Date   BRAIN SURGERY  right temporal lobectomy   VNS     VNS implant   Allergies[1]       06/23/2024    2:07 PM 06/02/2024    2:04 PM 11/27/2017    9:57 AM  Depression screen PHQ 2/9  Decreased Interest 0 0 0  Down, Depressed, Hopeless 0 0 0  PHQ - 2 Score 0 0 0  Altered sleeping 3 0   Tired, decreased energy 1 0   Change in appetite 0 0    Feeling bad or failure about yourself  0 0   Trouble concentrating 0 0   Moving slowly or fidgety/restless 0 0   Suicidal thoughts 0 0   PHQ-9 Score 4 0   Difficult doing work/chores Somewhat difficult Not difficult at all        06/23/2024    2:07 PM 06/02/2024    2:05 PM 11/27/2017    9:57 AM 07/18/2017    9:26 AM  GAD 7 : Generalized Anxiety Score  Nervous, Anxious, on Edge 0 0 0 0  Control/stop worrying 0 0 0 0  Worry too much - different things 0 0 0 0  Trouble relaxing 2 3 0 0  Restless 0 0 0 0  Easily annoyed or irritable 2 0 0 0  Afraid - awful might happen 0 0 0 0  Total GAD 7 Score 4 3 0 0  Anxiety Difficulty Somewhat difficult Extremely difficult        Review of Systems  Constitutional:  Negative for chills and fever.  HENT:  Negative for congestion, ear pain, sinus pain and sore throat.   Respiratory:  Positive for cough. Negative for shortness of breath.   Cardiovascular:  Negative for chest pain.  Gastrointestinal:  Negative for abdominal pain, constipation, diarrhea, heartburn, nausea and vomiting.  Genitourinary:  Negative for dysuria, frequency and urgency.  Neurological:  Negative for dizziness and headaches.  Endo/Heme/Allergies:  Negative for polydipsia.  Psychiatric/Behavioral:  Negative for depression and suicidal ideas. The patient is not nervous/anxious.       Objective:     BP 124/80 (BP Location: Left Arm, Cuff Size: Normal)   Pulse 86   Ht 5' 8.5 (1.74 m)   Wt 175 lb (79.4 kg)   SpO2 97%   BMI 26.22 kg/m  BP Readings from Last 3 Encounters:  06/23/24 124/80  06/02/24 122/84  08/24/18 107/81   Wt Readings from Last 3 Encounters:  06/23/24 175 lb (79.4 kg)  06/02/24 177 lb (80.3 kg)  11/27/17 155 lb 3.2 oz (70.4 kg)      Physical Exam Vitals and nursing note reviewed.  Constitutional:      Appearance: Normal appearance.  HENT:     Right Ear: Tympanic membrane, ear canal and external ear normal.     Left Ear: Tympanic membrane,  ear canal and external ear normal.     Nose: Nose normal.     Mouth/Throat:     Pharynx: Oropharynx is clear.  Eyes:     Conjunctiva/sclera: Conjunctivae normal.  Cardiovascular:     Rate and Rhythm: Normal rate and regular rhythm.     Pulses: Normal pulses.     Heart sounds: Normal heart sounds.  Pulmonary:     Effort: Pulmonary effort is normal.     Breath sounds: Normal breath sounds.  Neurological:     Mental Status: He is alert and oriented to person, place, and time.  Psychiatric:        Mood and Affect:  Mood normal.        Behavior: Behavior normal.        Thought Content: Thought content normal.        Judgment: Judgment normal.      No results found for any visits on 06/23/24.     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 - Prescribed promethazine  with dextromethorphan syrup, 5 mL at bedtime as needed; however it is contraindicated with his seizure medication. Prescription was cancelled through the pharmacy.  - Advised over-the-counter Delsym for daytime relief. - Encouraged increased fluid intake and nighttime humidifier use. - Advised rest and symptom monitoring. - he will update if symptoms worsen or do not improve.  Nephrolithiasis Multiple kidney stones with possible left-sided stone, no urinary symptoms. - Continue follow-up with urology for cystoscopy and evaluation.  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 - Continue annual colonoscopy surveillance.  Seizure disorder Seizures  managed with neurology, pacemaker turned off due to stress triggers. - Continue annual follow-up with neurology.  Prediabetes A1c at 5.7%, borderline prediabetes, no medication needed. - Provided reading material on management and dietary recommendations. - Advised diet monitoring and increased exercise.  General Health Maintenance Discussed immunizations and health maintenance, vaccines deferred due to symptoms. - Schedule flu and pneumonia vaccines post-symptom resolution. - Follow up in three months for physical exam and fasting cholesterol check.   Return in about 4 months (around 10/14/2024) for physical and fasting labs.SABRA Carrol Aurora, NP     [1] No Known Allergies  "

## 2024-06-23 NOTE — Patient Instructions (Addendum)
 Start promethazine -DM 5 ml at bedtime. It can make you sleepy.   Drink plenty of fluids.  Rest.  Use cool mist humidifer.   Follow up in 3 months for physical and fasting labs.   It was a pleasure to see you today!

## 2024-06-25 MED ORDER — BENZONATATE 200 MG PO CAPS: 200.0000 mg | ORAL_CAPSULE | Freq: Three times a day (TID) | ORAL | 0 refills | Status: AC | PRN

## 2024-06-25 NOTE — Addendum Note (Signed)
 Addended by: VINCENTE SHIVERS on: 06/25/2024 08:20 AM   Modules accepted: Orders

## 2024-06-30 ENCOUNTER — Emergency Department (HOSPITAL_COMMUNITY)
Admission: EM | Admit: 2024-06-30 | Discharge: 2024-06-30 | Disposition: A | Discharge status: Home / Self Care | Source: Ambulatory Visit | Attending: Emergency Medicine | Admitting: Emergency Medicine

## 2024-06-30 ENCOUNTER — Encounter: Admitting: Physician Assistant

## 2024-06-30 DIAGNOSIS — R300 Dysuria: Secondary | ICD-10-CM | POA: Insufficient documentation

## 2024-06-30 LAB — BASIC METABOLIC PANEL WITH GFR
Anion gap: 11 (ref 5–15)
BUN: 16 mg/dL (ref 6–20)
CO2: 26 mmol/L (ref 22–32)
Calcium: 10.1 mg/dL (ref 8.9–10.3)
Chloride: 102 mmol/L (ref 98–111)
Creatinine, Ser: 1.03 mg/dL (ref 0.61–1.24)
GFR, Estimated: >60 mL/min
Glucose, Bld: 91 mg/dL (ref 70–99)
Potassium: 4.7 mmol/L (ref 3.5–5.1)
Sodium: 139 mmol/L (ref 135–145)

## 2024-06-30 LAB — URINALYSIS, ROUTINE W REFLEX MICROSCOPIC
Bilirubin Urine: NEGATIVE
Glucose, UA: NEGATIVE mg/dL
Ketones, ur: NEGATIVE mg/dL
Leukocytes,Ua: NEGATIVE
Nitrite: NEGATIVE
Protein, ur: NEGATIVE mg/dL
Specific Gravity, Urine: 1.014 (ref 1.005–1.030)
pH: 8 (ref 5.0–8.0)

## 2024-06-30 LAB — CBC WITH DIFFERENTIAL/PLATELET
Abs Immature Granulocytes: 0.03 K/uL (ref 0.00–0.07)
Basophils Absolute: 0 K/uL (ref 0.0–0.1)
Basophils Relative: 1 %
Eosinophils Absolute: 0.2 K/uL (ref 0.0–0.5)
Eosinophils Relative: 3 %
HCT: 48.4 % (ref 39.0–52.0)
Hemoglobin: 16.2 g/dL (ref 13.0–17.0)
Immature Granulocytes: 0 %
Lymphocytes Relative: 39 %
Lymphs Abs: 3 K/uL (ref 0.7–4.0)
MCH: 31 pg (ref 26.0–34.0)
MCHC: 33.5 g/dL (ref 30.0–36.0)
MCV: 92.7 fL (ref 80.0–100.0)
Monocytes Absolute: 0.7 K/uL (ref 0.1–1.0)
Monocytes Relative: 9 %
Neutro Abs: 3.7 K/uL (ref 1.7–7.7)
Neutrophils Relative %: 48 %
Platelets: 313 K/uL (ref 150–400)
RBC: 5.22 MIL/uL (ref 4.22–5.81)
RDW: 11.9 % (ref 11.5–15.5)
WBC: 7.7 K/uL (ref 4.0–10.5)
nRBC: 0 % (ref 0.0–0.2)

## 2024-06-30 MED ORDER — OXYCODONE-ACETAMINOPHEN 5-325 MG PO TABS
1.0000 | ORAL_TABLET | Freq: Once | ORAL | Status: AC
  Administered 2024-06-30: 1 via ORAL
  Filled 2024-06-30: qty 1

## 2024-06-30 MED ORDER — PHENAZOPYRIDINE HCL 200 MG PO TABS: 200.0000 mg | ORAL_TABLET | Freq: Three times a day (TID) | ORAL | 0 refills | Status: AC

## 2024-06-30 MED ORDER — IBUPROFEN 600 MG PO TABS: 600.0000 mg | ORAL_TABLET | Freq: Four times a day (QID) | ORAL | 0 refills | Status: AC | PRN

## 2024-06-30 NOTE — ED Triage Notes (Signed)
 C/o penile pain in the head of the penis. Came over from urology clinic where they attempted to go into his urethra with a camera to check his stents. They used numbing gel, but the pain made the patient come up off the table.

## 2024-06-30 NOTE — ED Provider Triage Note (Signed)
 Emergency Medicine Provider Triage Evaluation Note  Omar Payne , a 54 y.o. male  was evaluated in triage.  Pt complains of burning pain in the head of my penis after cystoscopy this morning. Has not been able to urinate since the cystoscopy. He walked out of clinic without hearing from the urologist and came here.   Hx of epilepsy and is concerned the pain/stress may cause him to have one.  Endorses cold chills Denies fever, n/v  Review of Systems  Positive: N/a Negative: N/a  Physical Exam  BP 136/85   Pulse 94   Temp 97.6 F (36.4 C)   Resp 18   SpO2 96%  Gen:   Awake, no distress   Resp:  Normal effort  MSK:   Moves extremities without difficulty  Other:    Medical Decision Making  Medically screening exam initiated at 9:29 AM.  Appropriate orders placed.  Jerrod E Soave was informed that the remainder of the evaluation will be completed by another provider, this initial triage assessment does not replace that evaluation, and the importance of remaining in the ED until their evaluation is complete.     Beola Terrall RAMAN, [place] 06/30/24 [phone]

## 2024-06-30 NOTE — ED Provider Notes (Signed)
 " [place] EMERGENCY DEPARTMENT AT [facility] Provider Note   CSN: [id number] Arrival date & time: 06/30/24  9140     Patient presents with: No chief complaint on file.   Omar Payne is a 54 y.o. male.   54 year old male with prior medical history as detailed below presents for evaluation.  Patient reports that this morning he was at [facility] urology.  He had a cystoscopy performed for evaluation of reported chronic dysuria.  Apparently the procedure was very painful.  Per the patient's history, it is unclear whether the cystoscopy was completed.  He then walked out of the clinic and came directly to the ER to make sure that everything was okay.  He has been waiting approximately 7-1/2 hours for ED evaluation.  He reports that he is able to urinate without difficulty in the interim.  He reports that he has continued dysuria - however, the symptoms preceded the cystoscopy performed at [facility] this morning.  Patient reports that he has a history of epilepsy and is concerned that the pain and/or stress that he experienced with the attempted cystoscopy would precipitate a seizure.  Therefore he left.  He does not plan on following up with [facility].    The history is provided by the patient and medical records.       Prior to Admission medications  Medication Sig Start Date End Date Taking? Authorizing Provider  atorvastatin (LIPITOR) 20 MG tablet Take 1 tablet by mouth daily.    [provider]  benzonatate  (TESSALON ) 200 MG capsule Take 1 capsule (200 mg total) by mouth 3 (three) times daily as needed. 06/25/24   Vincente Shivers, NP  lacosamide (VIMPAT) 200 MG TABS tablet Take 200 mg by mouth. 09/26/22   [provider]  lisinopril (ZESTRIL) 20 MG tablet Take 20 mg by mouth daily. 06/17/18   [provider]  valACYclovir (VALTREX) 500 MG tablet  06/17/18   [provider]  Vedolizumab (ENTYVIO PEN) 108 MG/0.68ML SOAJ Inject 108 mg into the  skin. 06/20/22   [provider]    Allergies: Patient has no known allergies.    Review of Systems  All other systems reviewed and are negative.   Updated Vital Signs BP 136/85   Pulse 94   Temp 97.6 F (36.4 C)   Resp 18   SpO2 96%   Physical Exam Vitals and nursing note reviewed.  Constitutional:      General: He is not in acute distress.    Appearance: He is well-developed.  HENT:     Head: Normocephalic and atraumatic.  Eyes:     Conjunctiva/sclera: Conjunctivae normal.  Cardiovascular:     Rate and Rhythm: Normal rate and regular rhythm.     Heart sounds: No murmur heard. Pulmonary:     Effort: Pulmonary effort is normal. No respiratory distress.     Breath sounds: Normal breath sounds.  Abdominal:     Palpations: Abdomen is soft.     Tenderness: There is no abdominal tenderness.  Musculoskeletal:        General: No swelling.     Cervical back: Neck supple.  Skin:    General: Skin is warm and dry.     Capillary Refill: Capillary refill takes less than 2 seconds.  Neurological:     Mental Status: He is alert.  Psychiatric:        Mood and Affect: Mood normal.     (all labs ordered are listed, but only abnormal  results are displayed) Labs Reviewed  CBC WITH DIFFERENTIAL/PLATELET  BASIC METABOLIC PANEL WITH GFR  URINALYSIS, ROUTINE W REFLEX MICROSCOPIC    EKG: None  Radiology: No results found.   Procedures   Medications Ordered in the ED  oxyCODONE -acetaminophen  (PERCOCET/ROXICET) 5-325 MG per tablet 1 tablet (1 tablet Oral Given 06/30/24 0935)                                    Medical Decision Making Patient presents to this facility after apparent visit with urology for cystoscopy this morning.  Patient apparently was unhappy with his experience.  He reports significant pain with attempted cystoscopy.  Patient has been able to urinate since arriving here in the ED.  His labs and UA are not suggestive of significant acute  pathology.  Case discussed briefly with urology, Dr. Renda.  Dr. Renda is in agreement with ED plan of care.  Patient is advised to follow-up closely with urology.  He is advised that he may need cystoscopy with sedation.  Patient is interested in trial of Pyridium  for treatment of dysuria.  Importance of close follow-up stressed.  Strict return precautions given and understood.  Risk Prescription drug management.        Final diagnoses:  Dysuria    ED Discharge Orders          Ordered    phenazopyridine  (PYRIDIUM ) 200 MG tablet  3 times daily        06/30/24 1747               Laurice Maude BROCKS, MD 06/30/24 1751  "

## 2024-06-30 NOTE — Discharge Instructions (Addendum)
 Return for any problem.  You have been prescribed Pyridium  -hopefully this will help with your reported painful urination.

## 2024-06-30 NOTE — ED Notes (Signed)
 Pt provided with urine cup to obtain UA

## 2024-07-03 ENCOUNTER — Encounter: Payer: Self-pay | Admitting: General Practice

## 2024-07-03 DIAGNOSIS — R300 Dysuria: Secondary | ICD-10-CM

## 2024-07-07 NOTE — Progress Notes (Unsigned)
" ° °  07/07/24 4:21 PM   Tywan E Imbert 23-Nov-1970 [id number]   HPI: 54 y.o. male here for initial evaluation   ED visit, Darryle Law 06/30/2024  - Reportedly underwent cystoscopy that day at [facility]  - Very painful, unclear if procedure completed-presented to ED following   History of anxiety, depression, epilepsy, ulcerative colitis   PMH: Past Medical History:  Diagnosis Date   Hypertension    Localization-related (focal) (partial) symptomatic epilepsy and epileptic syndromes with complex partial seizures, intractable, without status epilepticus (HCC)    Ulcer     Surgical History: Past Surgical History:  Procedure Laterality Date   BRAIN SURGERY     right temporal lobectomy   VNS     VNS implant    Family History: Family History  Problem Relation Age of Onset   Hypertension Father    Anxiety disorder Father    ADD / ADHD Father    Bipolar disorder Brother    Anxiety disorder Brother    ADD / ADHD Brother    Arthritis Mother    Arthritis Paternal Grandmother    Arthritis Paternal Uncle    Asthma Son    ADD / ADHD Son    Arthritis Paternal Aunt     Social History:  reports that he quit smoking about 22 years ago. His smoking use included cigarettes. He has a 10 pack-year smoking history. He has never used smokeless tobacco. He reports that he does not drink alcohol and does not use drugs.      Physical Exam: There were no vitals taken for this visit.   Constitutional:  Alert and oriented, No acute distress. Cardiovascular: No clubbing, cyanosis, or edema. Respiratory: Normal respiratory effort, no increased work of breathing. GI: Nondistended GU: *** Skin: No rashes, bruises or suspicious lesions. Neurologic: Grossly intact, no focal deficits, moving all 4 extremities. Psychiatric: Normal mood and affect.  Laboratory Data:  Latest Reference Range & Units 06/30/24 16:15  Urinalysis, Routine w reflex microscopic  Rpt !  Appearance CLEAR  HAZY !   Bilirubin Urine NEGATIVE  NEGATIVE  Color, Urine YELLOW  YELLOW  Glucose, UA NEGATIVE mg/dL NEGATIVE  Hgb urine dipstick NEGATIVE  MODERATE !  Ketones, ur NEGATIVE mg/dL NEGATIVE  Leukocytes,Ua NEGATIVE  NEGATIVE  Nitrite NEGATIVE  NEGATIVE  pH 5.0 - 8.0  8.0  Protein NEGATIVE mg/dL NEGATIVE  Specific Gravity, Urine 1.005 - 1.030  1.014  Amorphous Crystal  PRESENT  Bacteria, UA NONE SEEN  RARE !  RBC / HPF 0 - 5 RBC/hpf 21-50  Squamous Epithelial / HPF 0 - 5 /HPF 0-5  WBC, UA 0 - 5 WBC/hpf 6-10   Component Ref Range & Units (hover) 1 mo ago  MICRO NUMBER: [id number]  SPECIMEN QUALITY: Adequate  Sample Source NOT GIVEN  STATUS: FINAL  Result: No Growth    Pertinent Imaging: N/A    Assessment & Plan:    Dysuria Assessment & Plan: UA (Jan 2026) - pyuria + 21-50 RBC, negative culture       Penne Skye, MD 07/07/2024  [facility] [address], [address] [place], [place] [address] [phone] "

## 2024-07-07 NOTE — Telephone Encounter (Signed)
 This encounter was created in error - please disregard.

## 2024-07-07 NOTE — Progress Notes (Signed)
 Advised we are unable to assist.   Patient already has Urology referral with appt on 07/12/24.  Will mark erroneous.

## 2024-07-07 NOTE — Assessment & Plan Note (Signed)
 UA (Jan 2026) - pyuria + 21-50 RBC, negative culture

## 2024-07-09 ENCOUNTER — Encounter: Payer: Self-pay | Admitting: Urology

## 2024-07-12 ENCOUNTER — Ambulatory Visit: Admitting: Urology

## 2024-07-12 DIAGNOSIS — R300 Dysuria: Secondary | ICD-10-CM

## 2024-07-20 NOTE — Assessment & Plan Note (Signed)
 UA (Jan 2026) - pyuria + 21-50 RBC, negative culture

## 2024-07-22 ENCOUNTER — Encounter: Payer: Self-pay | Admitting: Urology

## 2024-07-22 ENCOUNTER — Ambulatory Visit: Admitting: Urology

## 2024-07-22 VITALS — BP 134/89 | HR 80 | Ht 69.0 in | Wt 177.8 lb

## 2024-07-22 DIAGNOSIS — R300 Dysuria: Secondary | ICD-10-CM

## 2024-07-22 MED ORDER — TAMSULOSIN HCL 0.4 MG PO CAPS: 0.4000 mg | ORAL_CAPSULE | Freq: Every day | ORAL | 3 refills | Status: AC

## 2024-07-23 LAB — URINALYSIS, COMPLETE
Bilirubin, UA: NEGATIVE
Glucose, UA: NEGATIVE
Ketones, UA: NEGATIVE
Leukocytes,UA: NEGATIVE
Nitrite, UA: NEGATIVE
Protein,UA: NEGATIVE
RBC, UA: NEGATIVE
Specific Gravity, UA: 1.02 (ref 1.005–1.030)
Urobilinogen, Ur: 0.2 mg/dL (ref 0.2–1.0)
pH, UA: 6 (ref 5.0–7.5)

## 2024-07-23 LAB — MICROSCOPIC EXAMINATION

## 2024-08-05 ENCOUNTER — Ambulatory Visit (HOSPITAL_COMMUNITY)

## 2024-08-20 ENCOUNTER — Ambulatory Visit: Admitting: Urology
